# Patient Record
Sex: Male | Born: 1935 | Race: White | Hispanic: No | Marital: Married | Smoking: Former smoker
Health system: Southern US, Community
[De-identification: ages and names within clinical notes are randomized; demographics above are authoritative.]

## PROBLEM LIST (undated history)

## (undated) DIAGNOSIS — E039 Hypothyroidism, unspecified: Secondary | ICD-10-CM

## (undated) DIAGNOSIS — I499 Cardiac arrhythmia, unspecified: Secondary | ICD-10-CM

## (undated) DIAGNOSIS — I498 Other specified cardiac arrhythmias: Secondary | ICD-10-CM

## (undated) DIAGNOSIS — E785 Hyperlipidemia, unspecified: Secondary | ICD-10-CM

## (undated) DIAGNOSIS — I428 Other cardiomyopathies: Secondary | ICD-10-CM

## (undated) DIAGNOSIS — I493 Ventricular premature depolarization: Secondary | ICD-10-CM

## (undated) DIAGNOSIS — I2510 Atherosclerotic heart disease of native coronary artery without angina pectoris: Secondary | ICD-10-CM

## (undated) DIAGNOSIS — C801 Malignant (primary) neoplasm, unspecified: Secondary | ICD-10-CM

## (undated) DIAGNOSIS — M1990 Unspecified osteoarthritis, unspecified site: Secondary | ICD-10-CM

## (undated) HISTORY — DX: Hyperlipidemia, unspecified: E78.5

## (undated) HISTORY — DX: Ventricular premature depolarization: I49.3

## (undated) HISTORY — DX: Atherosclerotic heart disease of native coronary artery without angina pectoris: I25.10

## (undated) HISTORY — DX: Other specified cardiac arrhythmias: I49.8

---

## 2002-07-25 HISTORY — PX: CARDIAC CATHETERIZATION: SHX172

## 2007-05-17 DIAGNOSIS — I2510 Atherosclerotic heart disease of native coronary artery without angina pectoris: Secondary | ICD-10-CM

## 2007-05-17 HISTORY — DX: Atherosclerotic heart disease of native coronary artery without angina pectoris: I25.10

## 2009-04-12 ENCOUNTER — Ambulatory Visit (HOSPITAL_COMMUNITY): Admission: RE | Admit: 2009-04-12 | Discharge: 2009-04-13 | Payer: Self-pay | Admitting: Orthopaedic Surgery

## 2009-04-12 ENCOUNTER — Encounter (INDEPENDENT_AMBULATORY_CARE_PROVIDER_SITE_OTHER): Payer: Self-pay | Admitting: Orthopaedic Surgery

## 2010-06-23 IMAGING — CR DG CHEST 2V
2 series · 2 of 2 positions shown · non-contrast
Comparison: None.

CLINICAL DATA: L4-5 facet cyst and stenosis.  Preoperative
evaluation.  Hypertension.

CHEST - 2 VIEW

[view not recorded (1 of 2)]
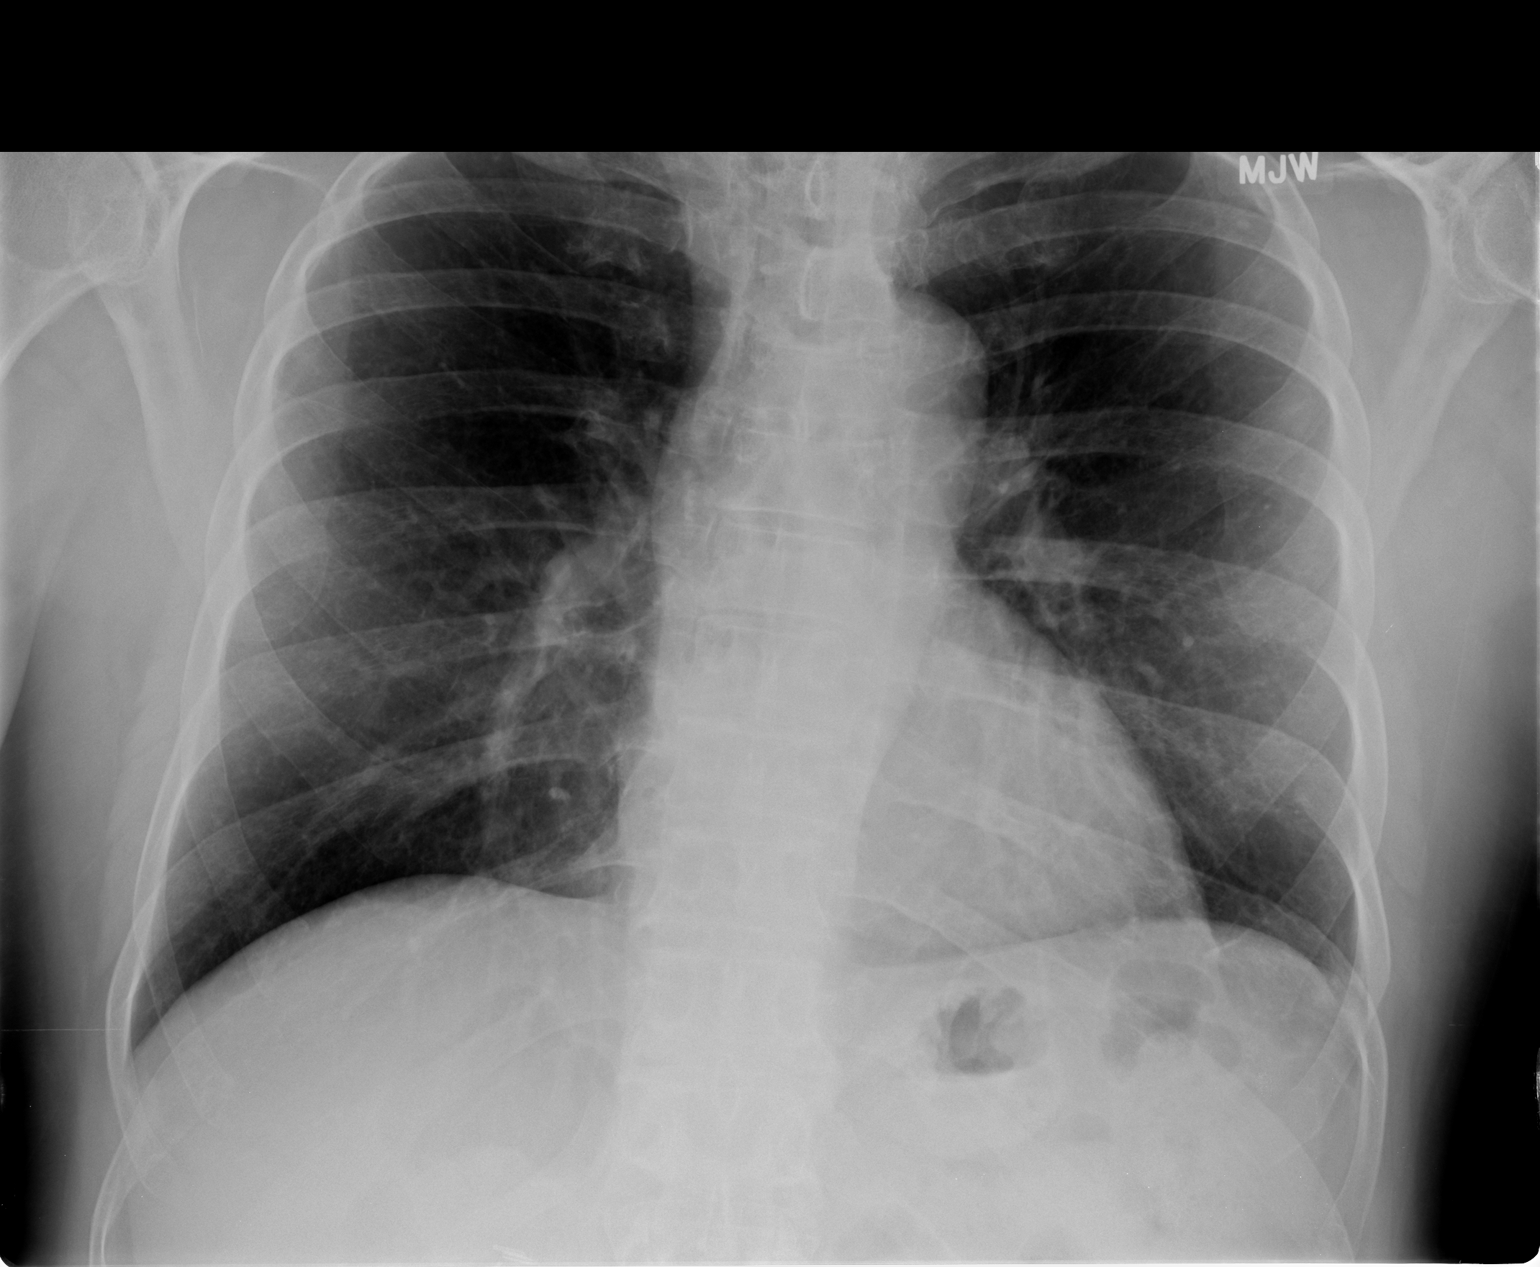

[view not recorded (2 of 2)]
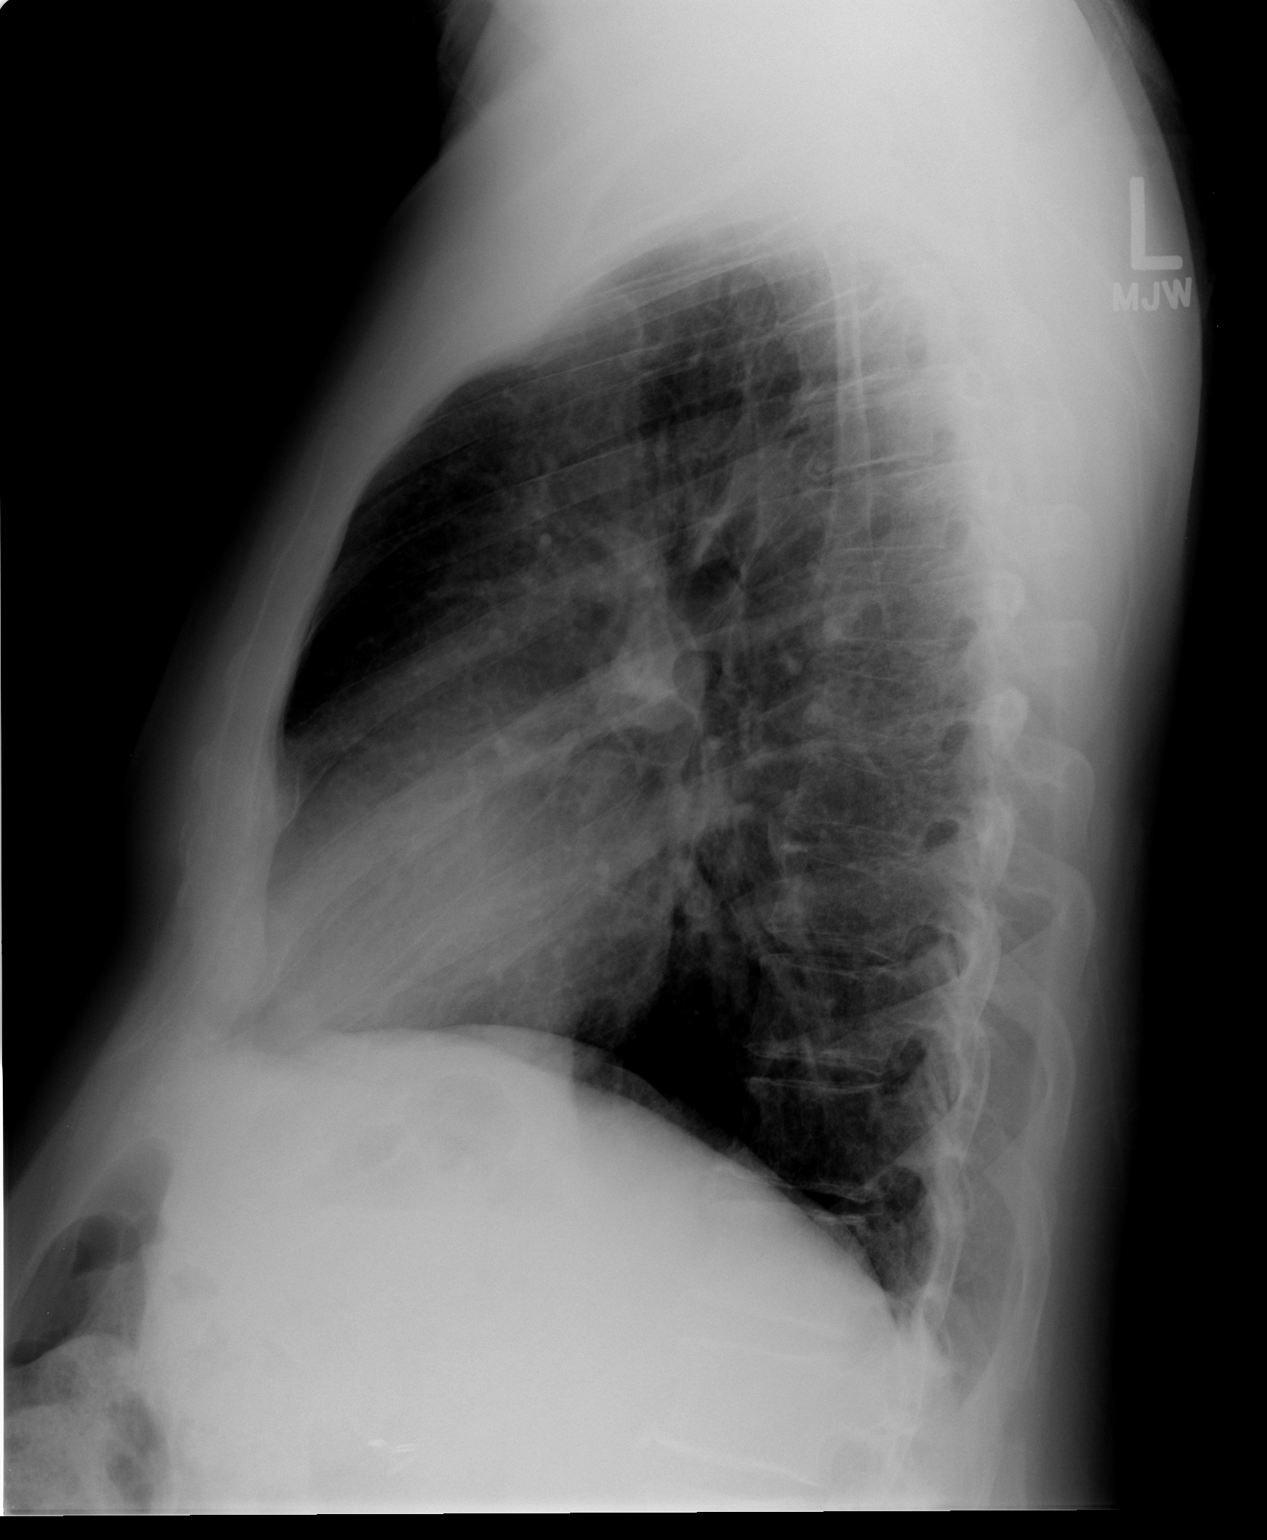

[2 of 2 positions shown; findings below may reference images not displayed]

FINDINGS: Normal sized heart.  Diffuse peribronchial thickening.
Small bone island or calcified granuloma in the lateral left apical
region.  Mild scoliosis.
IMPRESSION: Mild to moderate chronic bronchitic changes.

## 2010-06-23 IMAGING — CR DG LUMBAR SPINE 2-3V
1 series · 1 of 1 positions shown · non-contrast
Comparison: None.

CLINICAL DATA: L4-5 facet cyst.  Stenosis.

LUMBAR SPINE - 2-3 VIEW

[view not recorded]
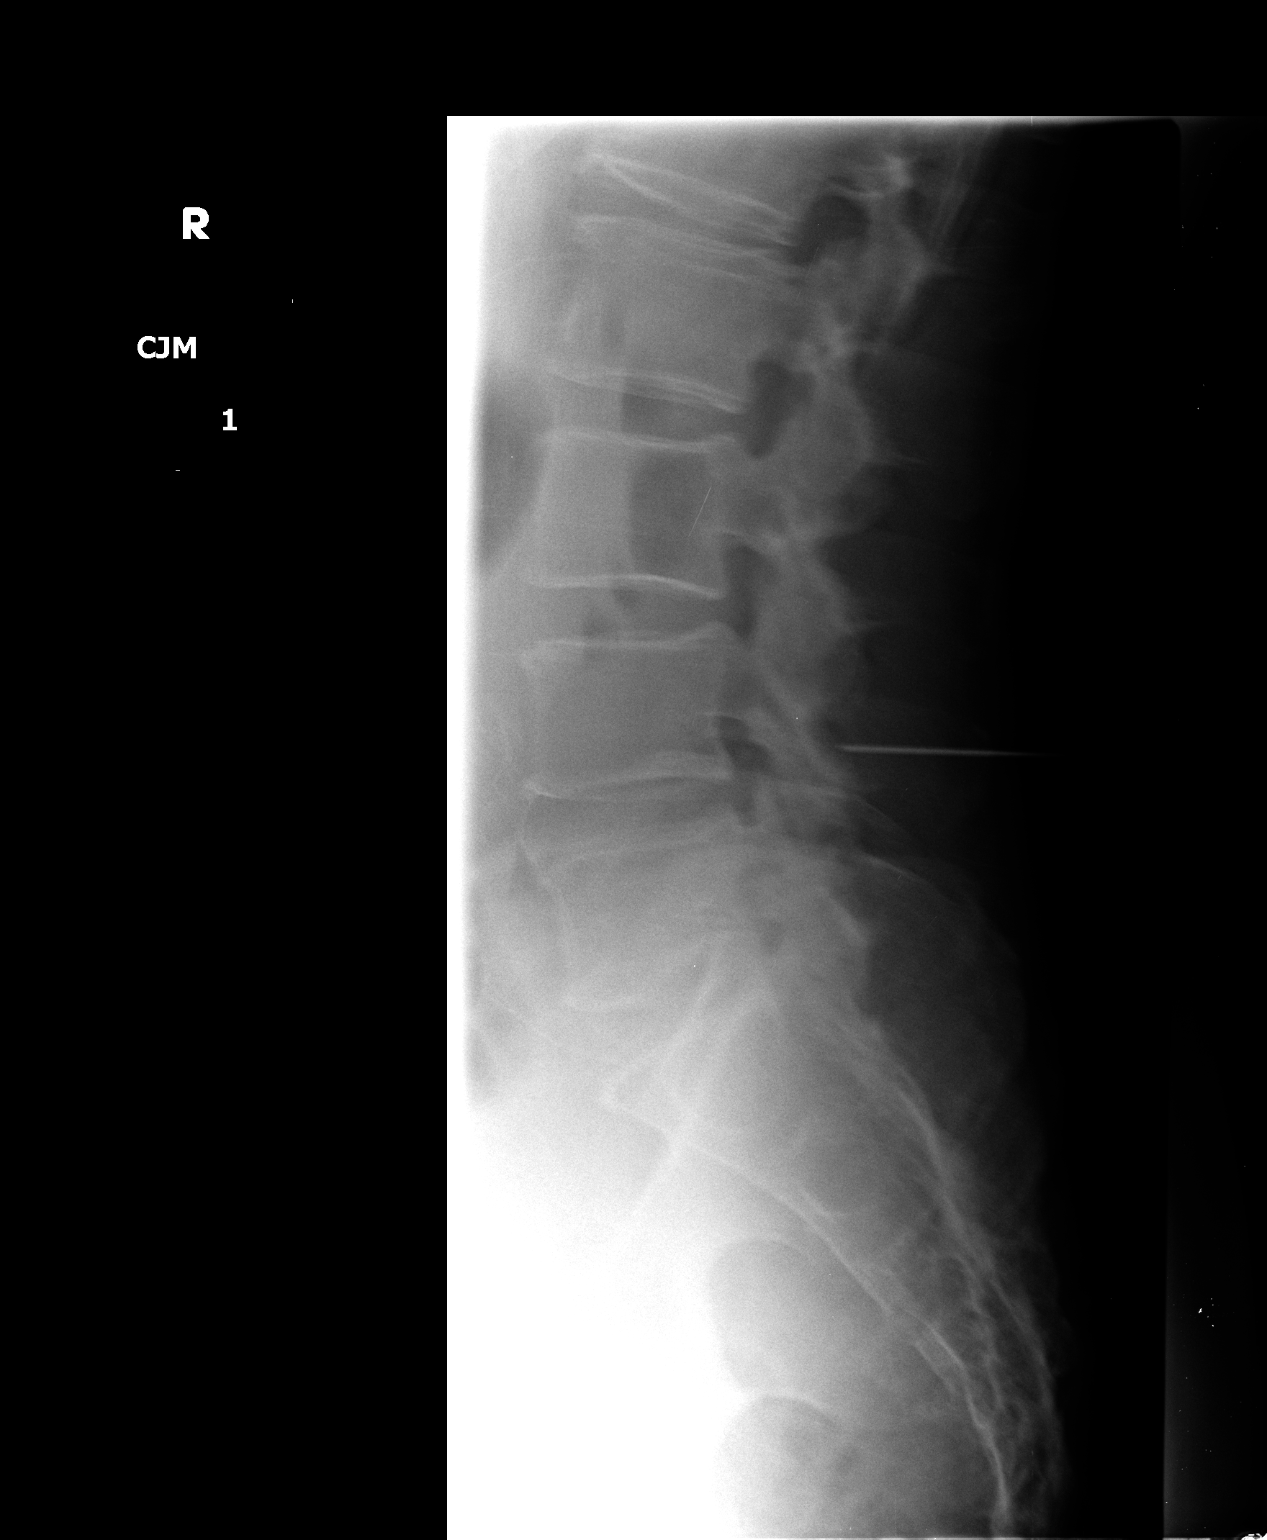

[1 of 1 positions shown; findings below may reference images not displayed]

FINDINGS: Two intraoperative lateral views of the lumbar spine
submitted for review after surgery.

Assuming the last fully open disc space is the L5-S1 level, first
film reveals metallic probe overlapping the L4 spinous process
directed towards the inferior aspect of the L4 vertebral body.

Second film submitted for review reveals localization of the L4-5
disc space level.

Vascular calcifications.
IMPRESSION: Localization L4-5 disc space level.

## 2010-12-07 DIAGNOSIS — I493 Ventricular premature depolarization: Secondary | ICD-10-CM

## 2010-12-07 HISTORY — DX: Ventricular premature depolarization: I49.3

## 2011-01-08 LAB — URINALYSIS, ROUTINE W REFLEX MICROSCOPIC
Bilirubin Urine: NEGATIVE
Glucose, UA: NEGATIVE mg/dL
Ketones, ur: NEGATIVE mg/dL
Protein, ur: NEGATIVE mg/dL
pH: 8 (ref 5.0–8.0)

## 2011-01-08 LAB — COMPREHENSIVE METABOLIC PANEL
Alkaline Phosphatase: 46 U/L (ref 39–117)
BUN: 9 mg/dL (ref 6–23)
CO2: 31 mEq/L (ref 19–32)
Chloride: 108 mEq/L (ref 96–112)
Creatinine, Ser: 1.07 mg/dL (ref 0.4–1.5)
GFR calc non Af Amer: >60 mL/min (ref >60)
Potassium: 4.5 mEq/L (ref 3.5–5.1)
Total Bilirubin: 0.7 mg/dL (ref 0.3–1.2)

## 2011-01-08 LAB — CBC
HCT: 43.5 % (ref 39.0–52.0)
Hemoglobin: 14.8 g/dL (ref 13.0–17.0)
MCV: 100.6 fL — ABNORMAL HIGH (ref 78.0–100.0)
RBC: 4.32 MIL/uL (ref 4.22–5.81)
WBC: 5.9 10*3/uL (ref 4.0–10.5)

## 2011-01-08 LAB — APTT: aPTT: 23 seconds — ABNORMAL LOW (ref 24–37)

## 2011-01-08 LAB — DIFFERENTIAL
Basophils Absolute: 0 10*3/uL (ref 0.0–0.1)
Basophils Relative: 0 % (ref 0–1)
Eosinophils Relative: 2 % (ref 0–5)
Lymphocytes Relative: 46 % (ref 12–46)
Neutro Abs: 2.4 10*3/uL (ref 1.7–7.7)

## 2011-01-08 LAB — PROTIME-INR: Prothrombin Time: 12.1 seconds (ref 11.6–15.2)

## 2011-02-14 NOTE — Op Note (Signed)
NAME:  Paul Huber, Paul Huber NO.:  [id number]   MEDICAL RECORD NO.:  [id number]          PATIENT TYPE:  OIB   LOCATION:  5034                         FACILITY:  [facility]   PHYSICIAN:  Mark C. Ophelia Charter, M.D.    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   POSTOPERATIVE DIAGNOSIS:  L4-5 facet cyst with spinal stenosis.   PROCEDURES:  1. Right L4 hemilaminectomy.  2. Partial L5 laminotomy.  3. Decompression and removal of hemorrhagic facet cyst.   SURGEON:  Mark C. Ophelia Charter, MD   ANESTHESIA:  GOT.   ESTIMATED BLOOD LOSS:  Less than 100 mL.   DRAINS:  None.   This 76 year old male who has had progressive increased right leg pain,  numbness, and L5 nerve root weakness with MRI scan showing a large  multilobulated extradural cyst with septation and severe compression.   PROCEDURE:  After induction of general anesthesia, the patient was  placed in prone with prepping of his back with DuraPrep.  Area squared  with towels, Betadine and Vi-Drape applied, and laminectomy sheets and  drapes.  Needle localization with spinal needle and cross-table lateral  x-ray showed it was at the level 4/5 disk as expected.  Surgical time-  out procedure check list was completed.  Incision was made and this was  at the midline centered over the L4-5 level.  Subperiosteal dissection  was performed out on the lamina and the Taylor retractors placed  laterally with 5-pound weight.  Laminotomy was performed, a little bit  of ligament was removed, and a #4 Penfield was placed 4/5 level at the  level of the disk and confirmed with lateral x-ray.  A 4-mm round bur  was used to thin down the lamina and then remaining was removed by hand  with Kerrison with paddies used to protect the dura, and hockey stick, a  blunt nerve hook for probing to make sure that  the cyst was not adherent  to the hypertrophic ligamentum.  He had some mild acquired stenosis, but  principally the severe stenosis problem was caused by the multilobulated  hemorrhagic cyst.  The cyst material was removed and sent for pathologic  examination.  Some was adherent to the dura, and with operative  microscope, microdissection was performed peeling it off the dura.  Foraminotomy was performed.  Bone was removed out to the level of the  pedicle.  Nerve root was easily identified and L5 portion of the lamina  was removed so that we can get all the way down to the bottom of the  pedicle, and by MRI completely to normal area, remote to the cyst with  some epidural fat present underneath the L5 lamina about halfway down.  Cyst was completely removed.  The dura was intact.  Disk was normal to  palpation.  There was no evidence of herniation.  Irrigation with saline  solution was performed followed by deep closure of the fascia with 0  Vicryl, 2-0 Vicryl for subcutaneous tissue, 4-0 Vicryl subcuticular  closure.  Tincture of benzoin, Steri-Strips,  Marcaine infiltration in the skin a less than 10 mL, 4 x 4s, ABD and  tape dressing.  The patient tolerated the procedure well, was  transferred to the recovery room in stable condition.  Instrument count  and needle count was correct.      Mark C. Ophelia Charter, M.D.  Electronically Signed     MCY/MEDQ  D:  04/12/2009  T:  04/13/2009  Job:  [id number]

## 2013-04-17 ENCOUNTER — Encounter: Payer: Self-pay | Admitting: Cardiovascular Disease

## 2013-04-18 ENCOUNTER — Encounter: Payer: Self-pay | Admitting: Cardiovascular Disease

## 2013-04-18 ENCOUNTER — Ambulatory Visit (INDEPENDENT_AMBULATORY_CARE_PROVIDER_SITE_OTHER): Payer: PRIVATE HEALTH INSURANCE | Admitting: Cardiovascular Disease

## 2013-04-18 VITALS — BP 130/88 | HR 95 | Resp 20 | Ht 71.0 in | Wt 199.3 lb

## 2013-04-18 DIAGNOSIS — I2510 Atherosclerotic heart disease of native coronary artery without angina pectoris: Secondary | ICD-10-CM

## 2013-04-18 DIAGNOSIS — I493 Ventricular premature depolarization: Secondary | ICD-10-CM | POA: Insufficient documentation

## 2013-04-18 DIAGNOSIS — E782 Mixed hyperlipidemia: Secondary | ICD-10-CM | POA: Insufficient documentation

## 2013-04-18 DIAGNOSIS — I498 Other specified cardiac arrhythmias: Secondary | ICD-10-CM | POA: Insufficient documentation

## 2013-04-18 DIAGNOSIS — I428 Other cardiomyopathies: Secondary | ICD-10-CM

## 2013-04-18 DIAGNOSIS — E663 Overweight: Secondary | ICD-10-CM | POA: Insufficient documentation

## 2013-04-18 DIAGNOSIS — Z6825 Body mass index (BMI) 25.0-25.9, adult: Secondary | ICD-10-CM

## 2013-04-18 DIAGNOSIS — I429 Cardiomyopathy, unspecified: Secondary | ICD-10-CM

## 2013-04-18 DIAGNOSIS — I4949 Other premature depolarization: Secondary | ICD-10-CM

## 2013-04-18 MED ORDER — DILTIAZEM HCL ER 240 MG PO CP24: 240.0000 mg | ORAL_CAPSULE | Freq: Every day | ORAL | Status: DC

## 2013-04-18 NOTE — Assessment & Plan Note (Addendum)
He has made substantial progress with weight loss is more physically fit and is new from the obese range to the overweight range. He is encouraged to keep up the good work note is made that he still has evidence of mild hyperglycemia without full-blown diabetes suggesting that he has underlying as well as assistance. We will review his lipid profile when it becomes available.Marland Kitchen

## 2013-04-18 NOTE — Assessment & Plan Note (Signed)
Previous lipid profile showed evidence of elevated triglycerides, low HDL cholesterol and mildly elevated total and LDL cholesterol consistent with a pattern of insulin resistance/metabolic syndrome. He'll be interesting to see how this has improved following substantial weight loss and increased physical activity.

## 2013-04-18 NOTE — Assessment & Plan Note (Signed)
This asymptomatic rhythm was incidentally noted at the time of his last visit and as far as I know has not recurred. It is of unclear etiology and of doubtful clinical significance.

## 2013-04-18 NOTE — Progress Notes (Signed)
Patient ID: DORSIE BURICH, male   DOB: 1935-09-04, 78 y.o.   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 He has made substantial progress with lifestyle changes he has lost 14 pounds in weight and has moved from near-obese to mid-overweight range. He exercises most days of the week, usually by walking. He has made healthy changes in his diet.  He is essentially asymptomatic. He is unaware of any palpitations. Has not had syncope dizziness lightheadedness chest pain or dyspnea. He denies edema orthopnea present nocturnal dyspnea.    No Known Allergies  Current Outpatient Prescriptions  Medication Sig Dispense Refill  . diltiazem (DILACOR XR) 240 MG 24 hr capsule Take 1 capsule (240 mg total) by mouth daily.  90 capsule  3  . fish oil-omega-3 fatty acids 1000 MG capsule Take 2 g by mouth daily.      Marland Kitchen levothyroxine (SYNTHROID, LEVOTHROID) 125 MCG tablet Take 125 mcg by mouth daily before breakfast.      . Multiple Vitamin (MULTIVITAMIN) tablet Take 1 tablet by mouth daily.      . sertraline (ZOLOFT) 50 MG tablet Take 50 mg by mouth daily.       No current facility-administered medications for this visit.    Past Medical History  Diagnosis Date  . CAD (coronary artery disease) 05/17/2007    STRESS TEST- EKG negative for ischemia  . PVC (premature ventricular contraction) 12/07/2010    2D ECHO- EF >55%  . Accelerated junctional rhythm   . Dyslipidemia     Past Surgical History  Procedure Laterality Date  . Cardiac catheterization Left 07/25/2002    No significant CAD    Family History  Problem Relation Age of Onset  . Dementia Mother   . Heart disease Father 50    CABGx2  . Cancer Maternal Grandmother   . Heart attack Maternal Grandfather   . Heart failure Paternal Grandfather     History   Social History  . Marital Status: Married     Spouse Name: N/A    Number of Children: N/A  . Years of Education: N/A   Occupational History  . Not on file.   Social History Main Topics  . Smoking status: Former Smoker    Types: Pipe  . Smokeless tobacco: Former Neurosurgeon    Quit date: 10/02/1979  . Alcohol Use: Yes     Comment: rare  . Drug Use: No  . Sexually Active: Not on file   Other Topics Concern  . Not on file   Social History Narrative  . No narrative on file    Review of systems: The patient specifically denies any chest pain at rest or with exertion, dyspnea at rest or with exertion, orthopnea, paroxysmal nocturnal dyspnea, syncope, palpitations, focal neurological deficits, intermittent claudication, lower extremity edema, unexplained weight gain, cough, hemoptysis or wheezing.  The patient also denies abdominal pain, nausea, vomiting, dysphagia, diarrhea, constipation, polyuria, polydipsia, dysuria, hematuria, frequency, urgency, abnormal bleeding or bruising, fever, chills, unexpected weight changes, mood swings, change in skin or hair texture, change in voice quality, auditory or visual problems, allergic reactions or rashes, new musculoskeletal complaints other than usual "aches and pains".   PHYSICAL EXAM BP 130/88  Pulse 95  Resp 20  Ht 5\' 11"  (1.803 m)  Wt 199 lb 4.8 oz (90.402 kg)  BMI 27.81 kg/m2  General:  Alert, oriented x3, no distress Head: no evidence of trauma, PERRL, EOMI, no exophtalmos or lid lag, no myxedema, no xanthelasma; normal ears, nose and oropharynx Neck: normal jugular venous pulsations and no hepatojugular reflux; brisk carotid pulses without delay and no carotid bruits Chest: clear to auscultation, no signs of consolidation by percussion or palpation, normal fremitus, symmetrical and full respiratory excursions Cardiovascular: normal position and quality of the apical impulse, regular rhythm, normal first and second heart sounds, no murmurs, rubs or gallops Abdomen: no tenderness  or distention, no masses by palpation, no abnormal pulsatility or arterial bruits, normal bowel sounds, no hepatosplenomegaly Extremities: no clubbing, cyanosis or edema; 2+ radial, ulnar and brachial pulses bilaterally; 2+ right femoral, posterior tibial and dorsalis pedis pulses; 2+ left femoral, posterior tibial and dorsalis pedis pulses; no subclavian or femoral bruits Neurological: grossly nonfocal   EKG: NSR, NORMAL  Lipid Panel - was not performed on his recent panel of labs, we'll order today No results found for this basename: chol, trig, hdl, cholhdl, vldl, ldlcalc    BMET    Component Value Date/Time   NA 144 04/12/2009 1335   K 4.5 04/12/2009 1335   CL 108 04/12/2009 1335   CO2 31 04/12/2009 1335   GLUCOSE 100* 04/12/2009 1335   BUN 9 04/12/2009 1335   CREATININE 1.07 04/12/2009 1335   CALCIUM 9.2 04/12/2009 1335   GFRNONAA >60 04/12/2009 1335   GFRAA  Value: >60        The eGFR has been calculated using the MDRD equation. This calculation has not been validated in all clinical situations. eGFR's persistently <60 mL/min signify possible Chronic Kidney Disease. 04/12/2009 1335     ASSESSMENT AND PLAN PVC's (premature ventricular contractions) Has had a lifelong history of PVCs to recently be somewhat less he is not at all symptomatic today.  Overweight (BMI 25.0-29.9) He has made substantial progress with weight loss is more physically fit and is new from the obese range to the overweight range. He is encouraged to keep up the good work note is made that he still has evidence of mild hyperglycemia without full-blown diabetes suggesting that he has underlying as well as assistance. We will review his lipid profile when it becomes available..   Cardiomyopathy Mildly depressed left ventricular systolic function was previously reported on his echocardiogram is but on the most recent ventricular outflow evaluation from March of 2012 left extrasystolic function was completely normal.  Note was made of systolic anterior motion of the mitral valve but without any evidence of LV output tract obstruction.  Accelerated junctional rhythm This asymptomatic rhythm was incidentally noted at the time of his last visit and as far as I know has not recurred. It is of unclear etiology and of doubtful clinical significance.  Mixed hyperlipidemia Previous lipid profile showed evidence of elevated triglycerides, low HDL cholesterol and mildly elevated total and LDL cholesterol consistent with a pattern of insulin resistance/metabolic syndrome. He'll be interesting to see how this has improved following substantial weight loss and increased physical activity.  Orders Placed This Encounter  Procedures  . Lipid Profile  . EKG 12-Lead   Meds ordered this encounter  Medications  . diltiazem (DILACOR XR) 240 MG 24 hr capsule    Sig: Take 1 capsule (240 mg total) by mouth daily.    Dispense:  90 capsule    Refill:  3    Livy Ross  Thurmon Fair, MD, [facility] [phone] office [phone] pager

## 2013-04-18 NOTE — Assessment & Plan Note (Signed)
Has had a lifelong history of PVCs to recently be somewhat less he is not at all symptomatic today.

## 2013-04-18 NOTE — Patient Instructions (Addendum)
Your physician recommends that you schedule a follow-up appointment in: ONE YEAR.  Continue current medications.

## 2013-04-18 NOTE — Assessment & Plan Note (Signed)
Mildly depressed left ventricular systolic function was previously reported on his echocardiogram is but on the most recent ventricular outflow evaluation from March of 2012 left extrasystolic function was completely normal. Note was made of systolic anterior motion of the mitral valve but without any evidence of LV output tract obstruction.

## 2014-05-01 ENCOUNTER — Other Ambulatory Visit: Payer: Self-pay | Admitting: Cardiovascular Disease

## 2014-05-01 NOTE — Telephone Encounter (Signed)
Rx was sent to pharmacy electronically. 

## 2014-05-28 ENCOUNTER — Ambulatory Visit: Payer: PRIVATE HEALTH INSURANCE | Admitting: Cardiovascular Disease

## 2014-06-04 ENCOUNTER — Encounter: Payer: Self-pay | Admitting: Cardiovascular Disease

## 2014-06-04 ENCOUNTER — Ambulatory Visit (INDEPENDENT_AMBULATORY_CARE_PROVIDER_SITE_OTHER): Payer: PRIVATE HEALTH INSURANCE | Admitting: Cardiovascular Disease

## 2014-06-04 VITALS — BP 114/74 | HR 88 | Resp 16 | Ht 71.0 in | Wt 207.0 lb

## 2014-06-04 DIAGNOSIS — I420 Dilated cardiomyopathy: Secondary | ICD-10-CM

## 2014-06-04 DIAGNOSIS — I493 Ventricular premature depolarization: Secondary | ICD-10-CM

## 2014-06-04 DIAGNOSIS — I428 Other cardiomyopathies: Secondary | ICD-10-CM

## 2014-06-04 DIAGNOSIS — I4949 Other premature depolarization: Secondary | ICD-10-CM

## 2014-06-04 DIAGNOSIS — I498 Other specified cardiac arrhythmias: Secondary | ICD-10-CM

## 2014-06-04 NOTE — Patient Instructions (Signed)
Dr. Croitoru recommends that you schedule a follow-up appointment in: One year.   

## 2014-06-04 NOTE — Progress Notes (Signed)
Patient ID: Paul Huber, male   DOB: [birth year], 79 y.o.   MRN: [id number]      Reason for office visit History of cardiomyopathy and arrhythmia  Paul Huber has a history of mildly depressed left ventricular systolic function in the remote past which had normalized by the time he had a fall echocardiogram in 2012. Has a lifelong history of premature ventricular contractions but recently these have been minimally symptomatic. During one of his routine office visits he was found to be in etc. to junctional rhythm, again asymptomatic and of doubtful clinical significance.  Since his last appointment he has not had any new serious health events and denies all cardiac complaints. He feels that his gait is less stable because of problems of his left hip. He has not had syncope or palpitations. He denies shortness of breath. He does complain of some fatigue and appears to have heat intolerance.  He has hypothyroidism on chronic hormone replacement therapy and his last TSH in April was normal. He had pneumonia after a cruise to [place] this summer. He still feels a little weak following that event. His wife thinks he is a little more irritable than usual, he is intolerance to heat. On exam his skin is very soft and velvety and his reflexes seem borderline hyperdynamic.   No Known Allergies  Current Outpatient Prescriptions  Medication Sig Dispense Refill  . diltiazem (DILACOR XR) 240 MG 24 hr capsule TAKE 1 CAPSULE BY MOUTH DAILY.  90 capsule  0  . fish oil-omega-3 fatty acids 1000 MG capsule Take 2 g by mouth daily.      Marland Kitchen levothyroxine (SYNTHROID, LEVOTHROID) 125 MCG tablet Take 125 mcg by mouth daily before breakfast.      . Multiple Vitamin (MULTIVITAMIN) tablet Take 1 tablet by mouth daily.      . sertraline (ZOLOFT) 50 MG tablet Take 50 mg by mouth daily.       No current facility-administered medications for this visit.    Past Medical History  Diagnosis Date  . CAD (coronary artery  disease) 05/17/2007    STRESS TEST- EKG negative for ischemia  . PVC (premature ventricular contraction) 12/07/2010    2D ECHO- EF >55%  . Accelerated junctional rhythm   . Dyslipidemia     Past Surgical History  Procedure Laterality Date  . Cardiac catheterization Left 07/25/2002    No significant CAD    Family History  Problem Relation Age of Onset  . Dementia Mother   . Heart disease Father 50    CABGx2  . Cancer Maternal Grandmother   . Heart attack Maternal Grandfather   . Heart failure Paternal Grandfather     History   Social History  . Marital Status: Married    Spouse Name: N/A    Number of Children: N/A  . Years of Education: N/A   Occupational History  . Not on file.   Social History Main Topics  . Smoking status: Former Smoker    Types: Pipe  . Smokeless tobacco: Former Neurosurgeon    Quit date: 10/02/1979  . Alcohol Use: Yes     Comment: rare  . Drug Use: No  . Sexual Activity: Not on file   Other Topics Concern  . Not on file   Social History Narrative  . No narrative on file    Review of systems: The patient specifically denies any chest pain at rest exertion, dyspnea at rest or with exertion, orthopnea, paroxysmal nocturnal dyspnea, syncope, palpitations,  focal neurological deficits, intermittent claudication, lower extremity edema, unexplained weight gain, cough, hemoptysis or wheezing.   PHYSICAL EXAM BP 114/74  Pulse 88  Resp 16  Ht  (1.803 m)  Wt 207 lb (93.895 kg)  BMI 28.88 kg/m2 General: Alert, oriented x3, no distress  Head: no evidence of trauma, PERRL, EOMI, no exophtalmos or lid lag, no myxedema, no xanthelasma; normal ears, nose and oropharynx  Neck: normal jugular venous pulsations and no hepatojugular reflux; brisk carotid pulses without delay and no carotid bruits  Chest: clear to auscultation, no signs of consolidation by percussion or palpation, normal fremitus, symmetrical and full respiratory excursions  Cardiovascular:  normal position and quality of the apical impulse, regular rhythm, normal first and second heart sounds, no murmurs, rubs or gallops  Abdomen: no tenderness or distention, no masses by palpation, no abnormal pulsatility or arterial bruits, normal bowel sounds, no hepatosplenomegaly  Extremities: no clubbing, cyanosis or edema; 2+ radial, ulnar and brachial pulses bilaterally; 2+ right femoral, posterior tibial and dorsalis pedis pulses; 2+ left femoral, posterior tibial and dorsalis pedis pulses; no subclavian or femoral bruits  Neurological: grossly nonfocal   EKG: NSR, borderline left axis deviation, otherwise normal   BMET Creatinine 0.96, sodium 145, potassium 4.2, glucose 80, normal liver function tests in April 2015  Lipid profile Total cholesterol 176, triglycerides 128, HDL 32, LDL 118 in July 2014  ASSESSMENT AND PLAN  Paul Huber has a questionable diagnosis of cardiomyopathy. Reportedly depressed left ventricular systolic function in the remote past was normal on an echocardiogram from 2012. He has a history of symptomatic PVCs but currently this problem is quite hesitant without any specific therapy. He has normal blood pressure. He has a few vague symptoms that are suggestive of thyrotoxicosis (relatively fast resting heart rate, intolerance to heat, irritability, soft skin) but his most recent TSH in April was normal.  No changes are made to his medical regimen. Followup on a yearly basis.  Junious Silk, MD, [facility] [phone] office [phone] pager

## 2014-07-27 ENCOUNTER — Other Ambulatory Visit: Payer: Self-pay | Admitting: Cardiovascular Disease

## 2014-07-28 NOTE — Telephone Encounter (Signed)
Rx was sent to pharmacy electronically. 

## 2015-07-01 ENCOUNTER — Encounter: Payer: Self-pay | Admitting: Cardiovascular Disease

## 2015-07-09 ENCOUNTER — Other Ambulatory Visit: Payer: Self-pay | Admitting: Cardiovascular Disease

## 2015-07-13 ENCOUNTER — Other Ambulatory Visit: Payer: Self-pay | Admitting: *Deleted

## 2015-07-13 MED ORDER — DILTIAZEM HCL ER 240 MG PO CP24: 240.0000 mg | ORAL_CAPSULE | Freq: Every day | ORAL | Status: DC

## 2015-08-06 ENCOUNTER — Encounter: Payer: Self-pay | Admitting: Cardiovascular Disease

## 2015-08-12 ENCOUNTER — Encounter: Payer: Self-pay | Admitting: Cardiovascular Disease

## 2015-08-12 ENCOUNTER — Ambulatory Visit (INDEPENDENT_AMBULATORY_CARE_PROVIDER_SITE_OTHER): Payer: PRIVATE HEALTH INSURANCE | Admitting: Cardiovascular Disease

## 2015-08-12 VITALS — BP 124/78 | HR 72 | Ht 72.0 in | Wt 215.6 lb

## 2015-08-12 DIAGNOSIS — E663 Overweight: Secondary | ICD-10-CM | POA: Diagnosis not present

## 2015-08-12 DIAGNOSIS — I429 Cardiomyopathy, unspecified: Secondary | ICD-10-CM

## 2015-08-12 DIAGNOSIS — I493 Ventricular premature depolarization: Secondary | ICD-10-CM

## 2015-08-12 DIAGNOSIS — E782 Mixed hyperlipidemia: Secondary | ICD-10-CM | POA: Diagnosis not present

## 2015-08-12 MED ORDER — DILTIAZEM HCL ER 240 MG PO CP24: 240.0000 mg | ORAL_CAPSULE | Freq: Every day | ORAL | Status: DC

## 2015-08-12 NOTE — Progress Notes (Signed)
Patient ID: Paul Huber, male   DOB: 12-16-1934, 80 y.o.   MRN: [id number]     Cardiology Office Note   Date:  08/12/2015   ID:  Paul Huber, DOB 1934-11-03, MRN [id number]  PCP:  Marcell Anger, NP  Cardiologist:   Thurmon Fair, MD   chief complaint: Follow-up for symptomatic PVCs and history of cardiomyopathy     History of Present Illness: Paul Huber is a 80 y.o. male who presents for routine follow-up. He has no cardiovascular complaints. Physical activity has been limited by pain in his right hip. He remains overweight, has actually gained weight and is borderline obese. He continues to work as a Warden/ranger.  The patient specifically denies any chest pain at rest or with exertion, dyspnea at rest or with exertion, orthopnea, paroxysmal nocturnal dyspnea, syncope, palpitations, focal neurological deficits, intermittent claudication, lower extremity edema, unexplained weight gain, cough, hemoptysis or wheezing.  The patient also denies abdominal pain, nausea, vomiting, dysphagia, diarrhea, constipation, polyuria, polydipsia, dysuria, hematuria, frequency, urgency, abnormal bleeding or bruising, fever, chills, unexpected weight changes, mood swings, change in skin or hair texture, change in voice quality, auditory or visual problems, allergic reactions or rashes, new musculoskeletal complaints other than usual "aches and pains".  Paul Huber has a history of mildly depressed left ventricular systolic function in the remote past which had normalized by the time he had a fall echocardiogram in 2012. Has a lifelong history of premature ventricular contractions but recently these have been minimally symptomatic. During one of his routine office visits he was found to be in etc. to junctional rhythm, again asymptomatic and of doubtful clinical significance.   Past Medical History  Diagnosis Date  . CAD (coronary artery disease) 05/17/2007    STRESS TEST- EKG negative for  ischemia  . PVC (premature ventricular contraction) 12/07/2010    2D ECHO- EF >55%  . Accelerated junctional rhythm   . Dyslipidemia     Past Surgical History  Procedure Laterality Date  . Cardiac catheterization Left 07/25/2002    No significant CAD     Current Outpatient Prescriptions  Medication Sig Dispense Refill  . diltiazem (DILACOR XR) 240 MG 24 hr capsule Take 1 capsule (240 mg total) by mouth daily. 90 capsule 0  . fish oil-omega-3 fatty acids 1000 MG capsule Take 2 g by mouth daily.    Marland Kitchen levothyroxine (SYNTHROID, LEVOTHROID) 125 MCG tablet Take 125 mcg by mouth daily before breakfast.    . Multiple Vitamin (MULTIVITAMIN) tablet Take 1 tablet by mouth daily.     No current facility-administered medications for this visit.    Allergies:   Review of patient's allergies indicates no known allergies.    Social History:  The patient  reports that he has quit smoking. His smoking use included Pipe. He quit smokeless tobacco use about 35 years ago. He reports that he drinks alcohol. He reports that he does not use illicit drugs.   Family History:  The patient's family history includes Cancer in his maternal grandmother; Dementia in his mother; Heart attack in his maternal grandfather; Heart disease (age of onset: 69) in his father; Heart failure in his paternal grandfather.    ROS:  Please see the history of present illness.    Otherwise, review of systems positive for none.   All other systems are reviewed and negative.    PHYSICAL EXAM: VS:  BP 124/78 mmHg  Pulse 72  Ht 6' (1.829 m)  Wt 215 lb 9.6 oz (  97.796 kg)  BMI 29.23 kg/m2 , BMI Body mass index is 29.23 kg/(m^2).  General: Alert, oriented x3, no distress Head: no evidence of trauma, PERRL, EOMI, no exophtalmos or lid lag, no myxedema, no xanthelasma; normal ears, nose and oropharynx Neck: normal jugular venous pulsations and no hepatojugular reflux; brisk carotid pulses without delay and no carotid  bruits Chest: clear to auscultation, no signs of consolidation by percussion or palpation, normal fremitus, symmetrical and full respiratory excursions Cardiovascular: normal position and quality of the apical impulse, regular rhythm, normal first and second heart sounds, no murmurs, rubs or gallops Abdomen: no tenderness or distention, no masses by palpation, no abnormal pulsatility or arterial bruits, normal bowel sounds, no hepatosplenomegaly Extremities: no clubbing, cyanosis or edema; 2+ radial, ulnar and brachial pulses bilaterally; 2+ right femoral, posterior tibial and dorsalis pedis pulses; 2+ left femoral, posterior tibial and dorsalis pedis pulses; no subclavian or femoral bruits Neurological: grossly nonfocal Psych: euthymic mood, full affect   EKG:  EKG is ordered today. The ekg ordered today demonstrates normal sinus rhythm   Recent Labs: No results found for requested labs within last 365 days.    Lipid Panel No results found for: CHOL, TRIG, HDL, CHOLHDL, VLDL, LDLCALC, LDLDIRECT    Wt Readings from Last 3 Encounters:  08/12/15 215 lb 9.6 oz (97.796 kg)  06/04/14 207 lb (93.895 kg)  04/18/13 199 lb 4.8 oz (90.402 kg)      ASSESSMENT AND PLAN:  He currently does not have any symptoms to suggest cardiomyopathy/congestive heart failure and does not have symptoms related to ventricular ectopy. From a clinical standpoint he appears to be euthyroid. Weight loss is again recommended. No changes are made to his medications. Yearly follow-up.    Current medicines are reviewed at length with the patient today.  The patient does not have concerns regarding medicines.  The following changes have been made:  no change  Labs/ tests ordered today include:  No orders of the defined types were placed in this encounter.     Patient Instructions  Dr. Royann Shivers recommends that you schedule a follow-up appointment in: ONE YEAR       SignedThurmon Fair, MD   08/12/2015 5:50 PM    Thurmon Fair, MD, [facility] [phone] office [phone] pager

## 2015-08-12 NOTE — Patient Instructions (Signed)
Dr. Croitoru recommends that you schedule a follow-up appointment in: ONE YEAR   

## 2016-08-10 ENCOUNTER — Other Ambulatory Visit: Payer: Self-pay | Admitting: Cardiovascular Disease

## 2016-10-06 ENCOUNTER — Ambulatory Visit: Payer: PRIVATE HEALTH INSURANCE | Admitting: Cardiovascular Disease

## 2016-11-10 ENCOUNTER — Encounter: Payer: Self-pay | Admitting: Cardiovascular Disease

## 2016-11-10 ENCOUNTER — Ambulatory Visit (INDEPENDENT_AMBULATORY_CARE_PROVIDER_SITE_OTHER): Payer: PRIVATE HEALTH INSURANCE | Admitting: Cardiovascular Disease

## 2016-11-10 VITALS — BP 118/68 | HR 62 | Ht 72.0 in | Wt 190.0 lb

## 2016-11-10 DIAGNOSIS — I429 Cardiomyopathy, unspecified: Secondary | ICD-10-CM | POA: Diagnosis not present

## 2016-11-10 DIAGNOSIS — I493 Ventricular premature depolarization: Secondary | ICD-10-CM

## 2016-11-10 DIAGNOSIS — I498 Other specified cardiac arrhythmias: Secondary | ICD-10-CM

## 2016-11-10 MED ORDER — DILTIAZEM HCL ER 120 MG PO CP24: 120.0000 mg | ORAL_CAPSULE | Freq: Every day | ORAL | 3 refills | Status: DC

## 2016-11-10 NOTE — Progress Notes (Signed)
Cardiology Office Note    Date:  11/10/2016   ID:  Paul Huber, DOB [birth year], MRN [id number]  PCP:  Marcell Anger, NP  Cardiologist:   Thurmon Fair, MD   Chief Complaint  Patient presents with  . Follow-up    History of Present Illness:  Paul Huber is a 82 y.o. male with a history of nonischemic cardiomyopathy and symptomatic PVCs presenting for follow-up. He was borderline obese but has made excellent efforts to improve his diet and has lost 25 pounds since his last appointment. He is not troubled by palpitations. She denies dizziness/syncope, chest pain or dyspnea at rest or with activity. He does not have lower extremity edema or claudication. He denies focal neurological complaints.  In the remote past he had a depressed left ventricular systolic function measured at 40-98 % in 2003, but repeat echo in 2012 showed normalization of systolic function. Cardiac catheterization in 2003 showed normal coronary arteries. He had symptomatic PACs and PVCs and has been on treatment with diltiazem for a long time.  For the second time in the last few years he presents to the office with asymptomatic accelerated junctional rhythm (negative P waves in the inferior leads with a relatively short PR interval).  Past Medical History:  Diagnosis Date  . Accelerated junctional rhythm   . CAD (coronary artery disease) 05/17/2007   STRESS TEST- EKG negative for ischemia  . Dyslipidemia   . PVC (premature ventricular contraction) 12/07/2010   2D ECHO- EF >55%    Past Surgical History:  Procedure Laterality Date  . CARDIAC CATHETERIZATION Left 07/25/2002   No significant CAD    Current Medications: Outpatient Medications Prior to Visit  Medication Sig Dispense Refill  . fish oil-omega-3 fatty acids 1000 MG capsule Take 2 g by mouth daily.    Marland Kitchen levothyroxine (SYNTHROID, LEVOTHROID) 125 MCG tablet Take 125 mcg by mouth daily before breakfast.    . Multiple Vitamin (MULTIVITAMIN)  tablet Take 1 tablet by mouth daily.    Marland Kitchen diltiazem (DILACOR XR) 240 MG 24 hr capsule TAKE 1 CAPSULE BY MOUTH DAILY. 90 capsule 3   No facility-administered medications prior to visit.      Allergies:   Patient has no known allergies.   Social History   Social History  . Marital status: Married    Spouse name: N/A  . Number of children: N/A  . Years of education: N/A   Social History Main Topics  . Smoking status: Former Smoker    Types: Pipe  . Smokeless tobacco: Former Neurosurgeon    Quit date: 10/02/1979  . Alcohol use Yes     Comment: rare  . Drug use: No  . Sexual activity: Not Asked   Other Topics Concern  . None   Social History Narrative  . None     Family History:  The patient's family history includes Cancer in his maternal grandmother; Dementia in his mother; Heart attack in his maternal grandfather; Heart disease (age of onset: 15) in his father; Heart failure in his paternal grandfather.   ROS:   Please see the history of present illness.    ROS All other systems reviewed and are negative.   PHYSICAL EXAM:   VS:  BP 118/68 (BP Location: Right Arm, Patient Position: Sitting, Cuff Size: Normal)   Pulse 62   Ht 6' (1.829 m)   Wt 86.2 kg (190 lb)   BMI 25.77 kg/m    GEN: Well nourished, well developed, in  no acute distress  HEENT: normal  Neck: no JVD, carotid bruits, or masses Cardiac: RRR; no murmurs, rubs, or gallops,no edema  Respiratory:  clear to auscultation bilaterally, normal work of breathing GI: soft, nontender, nondistended, + BS MS: no deformity or atrophy  Skin: warm and dry, no rash Neuro:  Alert and Oriented x 3, Strength and sensation are intact Psych: euthymic mood, full affect  Wt Readings from Last 3 Encounters:  11/10/16 86.2 kg (190 lb)  08/12/15 97.8 kg (215 lb 9.6 oz)  06/04/14 93.9 kg (207 lb)      Studies/Labs Reviewed:   EKG:  EKG is ordered today.  The ekg ordered today demonstrates Accelerated junctional rhythm  (negative P waves in the inferior leads, positive in aVR), PR interval 138 ms, flat T waves in the lateral leads, normal QTC 420 ms  Recent Labs: Creatinine 0.9, glucose 98 normal LFTs, hemoglobin 14.9  Lipid Panel May 2017 triglycerides 120, HDL 41, LDL 113, total cholesterol 178  ASSESSMENT:    1. PVC's (premature ventricular contractions)      PLAN:  In order of problems listed above:  1. Accelerated junctional rhythm: Reduce diltiazem 220 mg daily and consider stopping it altogether in several months if he does not have symptoms from ectopy. 2. History of cardiomyopathy: Last assessment showed normal left ventricle systolic function and he has no signs or symptoms of congestive heart failure. 3. PVCs, not currently symptomatic    Medication Adjustments/Labs and Tests Ordered: Current medicines are reviewed at length with the patient today.  Concerns regarding medicines are outlined above.  Medication changes, Labs and Tests ordered today are listed in the Patient Instructions below. Patient Instructions  Dr Royann Shivers has recommended making the following medication changes: 1. DECREASE Diltiazem to 120 mg daily  Your physician recommends that you schedule a follow-up appointment in 1 year. You will receive a reminder letter in the mail two months in advance. If you don't receive a letter, please call our office to schedule the follow-up appointment.  If you need a refill on your cardiac medications before your next appointment, please call your pharmacy.    Signed, Thurmon Fair, MD  11/10/2016 1:34 PM    [facility] [address] [place], [place], [place]  [address] Phone: [phone]; Fax: [phone]

## 2016-11-10 NOTE — Patient Instructions (Signed)
Dr Royann Shivers has recommended making the following medication changes: 1. DECREASE Diltiazem to 120 mg daily  Your physician recommends that you schedule a follow-up appointment in 1 year. You will receive a reminder letter in the mail two months in advance. If you don't receive a letter, please call our office to schedule the follow-up appointment.  If you need a refill on your cardiac medications before your next appointment, please call your pharmacy.

## 2017-11-09 ENCOUNTER — Other Ambulatory Visit: Payer: Self-pay | Admitting: Cardiovascular Disease

## 2017-11-09 NOTE — Telephone Encounter (Signed)
REFILL 

## 2017-12-07 ENCOUNTER — Encounter: Payer: Self-pay | Admitting: Cardiovascular Disease

## 2017-12-13 ENCOUNTER — Encounter: Payer: Self-pay | Admitting: Cardiovascular Disease

## 2018-01-01 ENCOUNTER — Telehealth: Payer: Self-pay | Admitting: Cardiovascular Disease

## 2018-01-01 NOTE — Telephone Encounter (Signed)
°  New message  Pt wife verbalized that she is calling for RN  She said that the pt labs all came back elevated and she  Wants Dr.Croitoru to advise

## 2018-01-01 NOTE — Telephone Encounter (Signed)
Pt's wife aware that we have no copy of recent lab and that whomever ordered can address results.Also instructed to have copies faxed to Dr Royann Shivers for our records as pt has an appt on 01/11/18 with Dr Royann Shivers .Wife verbalized understanding ./cy

## 2018-01-11 ENCOUNTER — Ambulatory Visit: Payer: PRIVATE HEALTH INSURANCE | Admitting: Cardiovascular Disease

## 2018-02-01 ENCOUNTER — Ambulatory Visit: Payer: PRIVATE HEALTH INSURANCE | Admitting: Cardiovascular Disease

## 2018-02-01 ENCOUNTER — Encounter: Payer: Self-pay | Admitting: Cardiovascular Disease

## 2018-02-01 VITALS — BP 146/72 | HR 79 | Ht 71.5 in | Wt 206.8 lb

## 2018-02-01 DIAGNOSIS — I429 Cardiomyopathy, unspecified: Secondary | ICD-10-CM

## 2018-02-01 DIAGNOSIS — E663 Overweight: Secondary | ICD-10-CM | POA: Diagnosis not present

## 2018-02-01 DIAGNOSIS — E782 Mixed hyperlipidemia: Secondary | ICD-10-CM

## 2018-02-01 DIAGNOSIS — R0602 Shortness of breath: Secondary | ICD-10-CM | POA: Diagnosis not present

## 2018-02-01 DIAGNOSIS — I498 Other specified cardiac arrhythmias: Secondary | ICD-10-CM

## 2018-02-01 NOTE — Progress Notes (Signed)
Cardiology Office Note    Date:  02/01/2018   ID:  Paul Huber, DOB [birth year], MRN [id number]  PCP:  Eda Paschal, NP  Cardiologist:   Thurmon Fair, MD   No chief complaint on file.   History of Present Illness:  Paul Huber is a 83 y.o. male with a history of nonischemic cardiomyopathy and symptomatic PVCs presenting for follow-up.   He has gained back a lot of weight, 16 pounds after he lost 25.  In parallel, he noticed worsening of his exercise tolerance (NYHA functional class II).  His blood pressure was a little high when he first checked in today, but when he saw his primary care provider recently his blood pressure was 120/80 mmHg. Re-check blood pressure today was 131/69 mmHg.  His recent lipid profile showed long-standing problems with low HDL, mildly elevated triglycerides, moderately elevated LDL cholesterol.  For his not to take cholesterol-lowering medicines, reports that his younger brother was told he needs bypass surgery.  In the remote past he had a depressed left ventricular systolic function measured at 40-98 % in 2003, but repeat echo in 2012 showed normalization of systolic function. Cardiac catheterization in 2003 showed normal coronary arteries. He had symptomatic PACs and PVCs and has been on treatment with diltiazem for a long time.  Twice in the last few years he presents to the office with asymptomatic accelerated junctional rhythm (negative P waves in the inferior leads with a relatively short PR interval).  Today he is in normal sinus rhythm.   Past Medical History:  Diagnosis Date  . Accelerated junctional rhythm   . CAD (coronary artery disease) 05/17/2007   STRESS TEST- EKG negative for ischemia  . Dyslipidemia   . PVC (premature ventricular contraction) 12/07/2010   2D ECHO- EF >55%    Past Surgical History:  Procedure Laterality Date  . CARDIAC CATHETERIZATION Left 07/25/2002   No significant CAD    Current Medications: Outpatient  Medications Prior to Visit  Medication Sig Dispense Refill  . diltiazem (DILACOR XR) 120 MG 24 hr capsule TAKE 1 CAPSULE BY MOUTH DAILY. 90 capsule 3  . fish oil-omega-3 fatty acids 1000 MG capsule Take 2 g by mouth daily.    Marland Kitchen levothyroxine (SYNTHROID, LEVOTHROID) 137 MCG tablet Take 1 tablet by mouth daily.    . Multiple Vitamin (MULTIVITAMIN) tablet Take 1 tablet by mouth daily.    . sertraline (ZOLOFT) 100 MG tablet Take 100 mg by mouth daily.  11  . levothyroxine (SYNTHROID, LEVOTHROID) 125 MCG tablet Take 125 mcg by mouth daily before breakfast.    . sertraline (ZOLOFT) 50 MG tablet Take 50 mg by mouth daily.  11   No facility-administered medications prior to visit.      Allergies:   Patient has no known allergies.   Social History   Socioeconomic History  . Marital status: Married    Spouse name: Not on file  . Number of children: Not on file  . Years of education: Not on file  . Highest education level: Not on file  Occupational History  . Not on file  Social Needs  . Financial resource strain: Not on file  . Food insecurity:    Worry: Not on file    Inability: Not on file  . Transportation needs:    Medical: Not on file    Non-medical: Not on file  Tobacco Use  . Smoking status: Former Smoker    Types: Pipe  . Smokeless tobacco:  Former Neurosurgeon    Quit date: 10/02/1979  Substance and Sexual Activity  . Alcohol use: Yes    Comment: rare  . Drug use: No  . Sexual activity: Not on file  Lifestyle  . Physical activity:    Days per week: Not on file    Minutes per session: Not on file  . Stress: Not on file  Relationships  . Social connections:    Talks on phone: Not on file    Gets together: Not on file    Attends religious service: Not on file    Active member of club or organization: Not on file    Attends meetings of clubs or organizations: Not on file    Relationship status: Not on file  Other Topics Concern  . Not on file  Social History Narrative  .  Not on file     Family History:  The patient's family history includes Cancer in his maternal grandmother; Dementia in his mother; Heart attack in his maternal grandfather; Heart disease (age of onset: 73) in his father; Heart failure in his paternal grandfather.   ROS:   Please see the history of present illness.    ROS All other systems reviewed and are negative.   PHYSICAL EXAM:   VS:  BP (!) 146/72   Pulse 79   Ht 5' 11.5" (1.816 m)   Wt 206 lb 12.8 oz (93.8 kg)   BMI 28.44 kg/m    GEN: Well nourished, well developed, in no acute distress  HEENT: normal  Neck: no JVD, carotid bruits, or masses Cardiac: RRR; no murmurs, rubs, or gallops,no edema  Respiratory:  clear to auscultation bilaterally, normal work of breathing GI: soft, nontender, nondistended, + BS MS: no deformity or atrophy  Skin: warm and dry, no rash Neuro:  Alert and Oriented x 3, Strength and sensation are intact Psych: euthymic mood, full affect  Wt Readings from Last 3 Encounters:  02/01/18 206 lb 12.8 oz (93.8 kg)  11/10/16 190 lb (86.2 kg)  08/12/15 215 lb 9.6 oz (97.8 kg)      Studies/Labs Reviewed:   EKG:  EKG is ordered today.  The ekg ordered today demonstrates rhythm, incomplete left bundle branch block (106 ms), no repolarization of normalities, QTC 486 ms Recent Labs: Recent labs from PCP glucose 108, creatinine 1.09, total cholesterol 202 and LDL140  ASSESSMENT:    1. Shortness of breath   2. Accelerated junctional rhythm   3. Cardiomyopathy, unspecified type (HCC)   4. Mixed hyperlipidemia   5. Overweight (BMI 25.0-29.9)      PLAN:  In order of problems listed above:  1. Accelerated junctional rhythm: Was never symptomatic, normal rhythm today. 2. History of cardiomyopathy: When we last evaluated left ventricular systolic function his EF had returned to normal.  There are no clinical findings to suggest congestive heart failure.  I would like to schedule him for plain treadmill  stress test to get an objective assessment of his exercise tolerance.  Suspect that the slight increase in shortness of breath described is related to weight gain. 3. Hyperlipidemia: His lipid profile strongly suggestive of insulin resistance/metabolic syndrome.  Also has borderline elevation in blood glucose.  He would rather not take lipid-lowering therapy, but he definitely needs to change his lifestyle.  He is to increase physical activity and reduce the intake of sugars and starches.  Try to lose at least 15 pounds.    Medication Adjustments/Labs and Tests Ordered: Current medicines are reviewed  at length with the patient today.  Concerns regarding medicines are outlined above.  Medication changes, Labs and Tests ordered today are listed in the Patient Instructions below. Patient Instructions  Dr Royann Shivers recommends that you continue on your current medications as directed. Please refer to the Current Medication list given to you today.  Your physician has requested that you have an exercise tolerance test. For further information please visit [email or web address] Please also follow instruction sheet, as given.  Dr Royann Shivers recommends that you schedule a follow-up appointment in 12 months. You will receive a reminder letter in the mail two months in advance. If you don't receive a letter, please call our office to schedule the follow-up appointment.  If you need a refill on your cardiac medications before your next appointment, please call your pharmacy.    Signed, Thurmon Fair, MD  02/01/2018 12:59 PM    [facility] [address], [place], [place]  [address] Phone: [phone]; Fax: [phone]

## 2018-02-01 NOTE — Patient Instructions (Signed)
Dr Croitoru recommends that you continue on your current medications as directed. Please refer to the Current Medication list given to you today.  Your physician has requested that you have an exercise tolerance test. For further information please visit [email or web address]. Please also follow instruction sheet, as given.  Dr Croitoru recommends that you schedule a follow-up appointment in 12 months. You will receive a reminder letter in the mail two months in advance. If you don't receive a letter, please call our office to schedule the follow-up appointment.  If you need a refill on your cardiac medications before your next appointment, please call your pharmacy. 

## 2018-02-12 ENCOUNTER — Telehealth (HOSPITAL_COMMUNITY): Payer: Self-pay

## 2018-02-12 NOTE — Telephone Encounter (Signed)
Encounter complete. 

## 2018-02-13 ENCOUNTER — Encounter: Payer: Self-pay | Admitting: Cardiovascular Disease

## 2018-02-14 ENCOUNTER — Ambulatory Visit (HOSPITAL_COMMUNITY)
Admission: RE | Admit: 2018-02-14 | Discharge: 2018-02-14 | Disposition: A | Discharge status: Home / Self Care | Payer: PRIVATE HEALTH INSURANCE | Source: Ambulatory Visit | Attending: Cardiology | Admitting: Cardiology

## 2018-02-14 DIAGNOSIS — R0602 Shortness of breath: Secondary | ICD-10-CM

## 2018-02-14 LAB — EXERCISE TOLERANCE TEST
CHL CUP MPHR: 139 {beats}/min
CHL CUP RESTING HR STRESS: 75 {beats}/min
CHL RATE OF PERCEIVED EXERTION: 19
CSEPEDS: 6 s
CSEPEW: 5.9 METS
CSEPPHR: 137 {beats}/min
Exercise duration (min): 4 min
Percent HR: 98 %

## 2018-12-05 ENCOUNTER — Other Ambulatory Visit: Payer: Self-pay | Admitting: Cardiovascular Disease

## 2018-12-05 MED ORDER — DILTIAZEM HCL ER 120 MG PO CP24: 120.0000 mg | ORAL_CAPSULE | Freq: Every day | ORAL | 0 refills | Status: DC

## 2018-12-05 NOTE — Telephone Encounter (Signed)
Medication ordered. And submitted.

## 2018-12-05 NOTE — Telephone Encounter (Signed)
New Message         Patient is calling in for an appt, there is nothing available waiting on the June schedule to come available. Patent's needs Diltiezem 120 mg filled for 90 days until we can get him in. Nothing is available at this time

## 2018-12-05 NOTE — Telephone Encounter (Signed)
New Message    *STAT* If patient is at the pharmacy, call can be transferred to refill team.   1. Which medications need to be refilled? (please list name of each medication and dose if known) diltiazem (DILACOR XR) 120 MG 24 hr capsule    2. Which pharmacy/location (including street and city if local pharmacy) is medication to be sent to? [facility]  3. Do they need a 30 day or 90 day supply? 90 day      Rx refill was sent to [facility] he wants it to go to [facility].

## 2018-12-06 MED ORDER — DILTIAZEM HCL ER 120 MG PO CP24: 120.0000 mg | ORAL_CAPSULE | Freq: Every day | ORAL | 0 refills | Status: DC

## 2018-12-06 NOTE — Telephone Encounter (Signed)
Rx(s) sent to pharmacy electronically.  

## 2019-01-22 ENCOUNTER — Telehealth: Payer: Self-pay | Admitting: Cardiovascular Disease

## 2019-01-22 NOTE — Telephone Encounter (Signed)
LVM to call and schedule 1 year followup.

## 2019-02-05 ENCOUNTER — Telehealth: Payer: Self-pay | Admitting: Cardiovascular Disease

## 2019-02-05 NOTE — Telephone Encounter (Signed)
Sent My-Chart message

## 2019-02-27 MED ORDER — DILTIAZEM HCL ER 120 MG PO CP24: 120.0000 mg | ORAL_CAPSULE | Freq: Every day | ORAL | 0 refills | Status: DC

## 2019-05-15 ENCOUNTER — Other Ambulatory Visit: Payer: Self-pay

## 2019-05-15 ENCOUNTER — Encounter: Payer: Self-pay | Admitting: Cardiovascular Disease

## 2019-05-15 ENCOUNTER — Ambulatory Visit (INDEPENDENT_AMBULATORY_CARE_PROVIDER_SITE_OTHER): Payer: Medicare Other | Admitting: Cardiovascular Disease

## 2019-05-15 VITALS — BP 141/81 | HR 83 | Ht 71.0 in | Wt 222.6 lb

## 2019-05-15 DIAGNOSIS — I491 Atrial premature depolarization: Secondary | ICD-10-CM | POA: Diagnosis not present

## 2019-05-15 DIAGNOSIS — I429 Cardiomyopathy, unspecified: Secondary | ICD-10-CM | POA: Diagnosis not present

## 2019-05-15 DIAGNOSIS — E782 Mixed hyperlipidemia: Secondary | ICD-10-CM

## 2019-05-15 NOTE — Progress Notes (Signed)
Cardiology Office Note    Date:  05/17/2019   ID:  Paul Huber, DOB 16-Jan-1935, MRN [id number]  PCP:  Eda Paschalavis, Debra L, NP  Cardiologist:   Thurmon FairMihai Namya Voges, MD   Chief Complaint  Patient presents with  . Irregular Heart Beat    History of Present Illness:  Paul Huber is a 84 y.o. male with a history of nonischemic cardiomyopathy and symptomatic PVCs presenting for follow-up.   No cardiac complaints. No real palpitations recently. Has poorly fitting painful dentures and is eating soft foods. At home BP is consistently 120s/70s. Sedentary, without angina or dyspnea. Last year had ETT with poor exercise performance (a little more than 4 minutes on the Bruce protocol), but no ECG changes.  In the remote past he had a depressed left ventricular systolic function measured at 14-7840-45 % in 2003, but repeat echo in 2012 showed normalization of systolic function. Cardiac catheterization in 2003 showed normal coronary arteries. He had symptomatic PACs and PVCs and has been on treatment with diltiazem for a long time.  Twice in the last few years he presented to the office with asymptomatic accelerated junctional rhythm (negative P waves in the inferior leads with a relatively short PR interval).  Today he is in normal sinus rhythm with rare PACs.   Past Medical History:  Diagnosis Date  . Accelerated junctional rhythm   . CAD (coronary artery disease) 05/17/2007   STRESS TEST- EKG negative for ischemia  . Dyslipidemia   . PVC (premature ventricular contraction) 12/07/2010   2D ECHO- EF >55%    Past Surgical History:  Procedure Laterality Date  . CARDIAC CATHETERIZATION Left 07/25/2002   No significant CAD    Current Medications: Outpatient Medications Prior to Visit  Medication Sig Dispense Refill  . diltiazem (DILACOR XR) 120 MG 24 hr capsule Take 1 capsule (120 mg total) by mouth daily. 90 capsule 0  . fish oil-omega-3 fatty acids 1000 MG capsule Take 2 g by mouth daily.    Marland Kitchen.  levothyroxine (SYNTHROID, LEVOTHROID) 137 MCG tablet Take 1 tablet by mouth daily.    . Multiple Vitamin (MULTIVITAMIN) tablet Take 1 tablet by mouth daily.    . sertraline (ZOLOFT) 100 MG tablet Take 100 mg by mouth daily.  11   No facility-administered medications prior to visit.      Allergies:   Patient has no known allergies.   Social History   Socioeconomic History  . Marital status: Married    Spouse name: Not on file  . Number of children: Not on file  . Years of education: Not on file  . Highest education level: Not on file  Occupational History  . Not on file  Social Needs  . Financial resource strain: Not on file  . Food insecurity    Worry: Not on file    Inability: Not on file  . Transportation needs    Medical: Not on file    Non-medical: Not on file  Tobacco Use  . Smoking status: Former Smoker    Types: Pipe  . Smokeless tobacco: Former NeurosurgeonUser    Quit date: 10/02/1979  Substance and Sexual Activity  . Alcohol use: Yes    Comment: rare  . Drug use: No  . Sexual activity: Not on file  Lifestyle  . Physical activity    Days per week: Not on file    Minutes per session: Not on file  . Stress: Not on file  Relationships  . Social connections  Talks on phone: Not on file    Gets together: Not on file    Attends religious service: Not on file    Active member of club or organization: Not on file    Attends meetings of clubs or organizations: Not on file    Relationship status: Not on file  Other Topics Concern  . Not on file  Social History Narrative  . Not on file     Family History:  The patient's family history includes Cancer in his maternal grandmother; Dementia in his mother; Heart attack in his maternal grandfather; Heart disease in his brother; Heart disease (age of onset: 33) in his father; Heart failure in his paternal grandfather.   ROS:   Please see the history of present illness.    ROS All other systems are reviewed and are negative    PHYSICAL EXAM:   VS:  BP (!) 141/81   Pulse 83   Ht 5\' 11"  (1.803 m)   Wt 222 lb 9.6 oz (101 kg)   SpO2 96%   BMI 31.05 kg/m     General: Alert, oriented x3, no distress, mildly obese Head: no evidence of trauma, PERRL, EOMI, no exophtalmos or lid lag, no myxedema, no xanthelasma; normal ears, nose and oropharynx Neck: normal jugular venous pulsations and no hepatojugular reflux; brisk carotid pulses without delay and no carotid bruits Chest: clear to auscultation, no signs of consolidation by percussion or palpation, normal fremitus, symmetrical and full respiratory excursions Cardiovascular: normal position and quality of the apical impulse, regular rhythm, normal first and second heart sounds, no murmurs, rubs or gallops Abdomen: no tenderness or distention, no masses by palpation, no abnormal pulsatility or arterial bruits, normal bowel sounds, no hepatosplenomegaly Extremities: no clubbing, cyanosis or edema; 2+ radial, ulnar and brachial pulses bilaterally; 2+ right femoral, posterior tibial and dorsalis pedis pulses; 2+ left femoral, posterior tibial and dorsalis pedis pulses; no subclavian or femoral bruits Neurological: grossly nonfocal Psych: Normal mood and affect   Wt Readings from Last 3 Encounters:  05/15/19 222 lb 9.6 oz (101 kg)  02/01/18 206 lb 12.8 oz (93.8 kg)  11/10/16 190 lb (86.2 kg)      Studies/Labs Reviewed:   EKG:  EKG is ordered today.  The ekg ordered today demonstrates sinus rhythm with rare PACs, unchanged incomplete LBBB, nonspecific repol changes, QTc 467 ms Recent Labs: We drew a lipid profile today, but he had eaten an avocado sandwich and TG were high  ASSESSMENT:    1. Mixed hyperlipidemia   2. Cardiomyopathy, unspecified type (HCC)   3. PAC (premature atrial contraction)      PLAN:  In order of problems listed above:  1. Accelerated junctional rhythm: Was never symptomatic, normal rhythm today. Has had PACs and PVCs, not syomptomatic  2. History of cardiomyopathy: When we last evaluated left ventricular systolic function his EF had returned to normal.  He is sedentary. No signs of hypervolemia, does not need diuretics. 3. Hyperlipidemia: he prefers not to take meds. Labs today were not edifiying since he was not fasting, but his cholesterol is lower than before.    Medication Adjustments/Labs and Tests Ordered: Current medicines are reviewed at length with the patient today.  Concerns regarding medicines are outlined above.  Medication changes, Labs and Tests ordered today are listed in the Patient Instructions below. Patient Instructions  Medication Instructions:  Your physician recommends that you continue on your current medications as directed. Please refer to the Current Medication list given  to you today.  If you need a refill on your cardiac medications before your next appointment, please call your pharmacy.   Lab work: Your provider would like for you to have the following labs today: Lipid and CMET  If you have labs (blood work) drawn today and your tests are completely normal, you will receive your results only by: [place] (if you have MyChart) OR A paper copy in the mail If you have any lab test that is abnormal or we need to change your treatment, we will call you to review the results.  Testing/Procedures: None ordered  Follow-Up: At [facility], you and your health needs are our priority.  As part of our continuing mission to provide you with exceptional heart care, we have created designated Provider Care Teams.  These Care Teams include your primary Cardiologist (physician) and Advanced Practice Providers (APPs -  Physician Assistants and Nurse Practitioners) who all work together to provide you with the care you need, when you need it. You will need a follow up appointment in 12 months.  Please call our office 2 months in advance to schedule this appointment.  You may see Sanda Klein, MD  or one of the following Advanced Practice Providers on your designated Care Team: Almyra Deforest, PA-C Fabian Sharp, [place]         Signed, Sanda Klein, MD  05/17/2019 10:42 PM    [address] [place], [place], [place]  [address] Phone: [phone]; Fax: [phone]

## 2019-05-15 NOTE — Patient Instructions (Signed)
Medication Instructions:  Your physician recommends that you continue on your current medications as directed. Please refer to the Current Medication list given to you today.  If you need a refill on your cardiac medications before your next appointment, please call your pharmacy.   Lab work: Your provider would like for you to have the following labs today: Lipid and CMET  If you have labs (blood work) drawn today and your tests are completely normal, you will receive your results only by: MyChart Message (if you have MyChart) OR A paper copy in the mail If you have any lab test that is abnormal or we need to change your treatment, we will call you to review the results.  Testing/Procedures: None ordered   Follow-Up: At [facility], you and your health needs are our priority.  As part of our continuing mission to provide you with exceptional heart care, we have created designated Provider Care Teams.  These Care Teams include your primary Cardiologist (physician) and Advanced Practice Providers (APPs -  Physician Assistants and Nurse Practitioners) who all work together to provide you with the care you need, when you need it. You will need a follow up appointment in 12 months.  Please call our office 2 months in advance to schedule this appointment.  You may see Mihai Croitoru, MD or one of the following Advanced Practice Providers on your designated Care Team: Hao Meng, PA-C Angela Duke, PA-C       

## 2019-05-16 LAB — COMPREHENSIVE METABOLIC PANEL
ALT: 22 IU/L (ref 0–44)
AST: 20 IU/L (ref 0–40)
Albumin/Globulin Ratio: 1.8 (ref 1.2–2.2)
Albumin: 4.3 g/dL (ref 3.6–4.6)
Alkaline Phosphatase: 65 IU/L (ref 39–117)
BUN/Creatinine Ratio: 12 (ref 10–24)
BUN: 13 mg/dL (ref 8–27)
Bilirubin Total: 0.4 mg/dL (ref 0.0–1.2)
CO2: 26 mmol/L (ref 20–29)
Calcium: 9 mg/dL (ref 8.6–10.2)
Chloride: 105 mmol/L (ref 96–106)
Creatinine, Ser: 1.13 mg/dL (ref 0.76–1.27)
GFR calc Af Amer: 70 mL/min/{1.73_m2} (ref >59)
GFR calc non Af Amer: 60 mL/min/{1.73_m2} (ref >59)
Globulin, Total: 2.4 g/dL (ref 1.5–4.5)
Glucose: 95 mg/dL (ref 65–99)
Potassium: 4.3 mmol/L (ref 3.5–5.2)
Sodium: 145 mmol/L — ABNORMAL HIGH (ref 134–144)
Total Protein: 6.7 g/dL (ref 6.0–8.5)

## 2019-05-16 LAB — LIPID PANEL
Chol/HDL Ratio: 7 ratio — ABNORMAL HIGH (ref 0.0–5.0)
Cholesterol, Total: 188 mg/dL (ref 100–199)
HDL: 27 mg/dL — ABNORMAL LOW (ref >39)
Triglycerides: 412 mg/dL — ABNORMAL HIGH (ref 0–149)

## 2019-05-17 ENCOUNTER — Encounter: Payer: Self-pay | Admitting: Cardiovascular Disease

## 2019-06-02 ENCOUNTER — Other Ambulatory Visit: Payer: Self-pay

## 2019-06-02 MED ORDER — DILTIAZEM HCL ER 120 MG PO CP24: 120.0000 mg | ORAL_CAPSULE | Freq: Every day | ORAL | 1 refills | Status: DC

## 2019-12-01 ENCOUNTER — Other Ambulatory Visit: Payer: Self-pay | Admitting: *Deleted

## 2019-12-01 MED ORDER — DILTIAZEM HCL ER 120 MG PO CP24: 120.0000 mg | ORAL_CAPSULE | Freq: Every day | ORAL | 1 refills | Status: DC

## 2020-06-04 ENCOUNTER — Other Ambulatory Visit: Payer: Self-pay

## 2020-06-04 ENCOUNTER — Other Ambulatory Visit: Payer: Self-pay | Admitting: Cardiovascular Disease

## 2020-08-09 ENCOUNTER — Other Ambulatory Visit: Payer: Self-pay | Admitting: Cardiovascular Disease

## 2020-08-12 ENCOUNTER — Encounter: Payer: Self-pay | Admitting: Cardiovascular Disease

## 2020-08-12 ENCOUNTER — Ambulatory Visit (INDEPENDENT_AMBULATORY_CARE_PROVIDER_SITE_OTHER): Payer: Medicare Other | Admitting: Cardiovascular Disease

## 2020-08-12 VITALS — BP 126/78 | HR 83 | Ht 71.0 in | Wt 224.0 lb

## 2020-08-12 DIAGNOSIS — R002 Palpitations: Secondary | ICD-10-CM

## 2020-08-12 DIAGNOSIS — R0602 Shortness of breath: Secondary | ICD-10-CM | POA: Diagnosis not present

## 2020-08-12 DIAGNOSIS — I498 Other specified cardiac arrhythmias: Secondary | ICD-10-CM | POA: Diagnosis not present

## 2020-08-12 DIAGNOSIS — E782 Mixed hyperlipidemia: Secondary | ICD-10-CM | POA: Diagnosis not present

## 2020-08-12 DIAGNOSIS — I429 Cardiomyopathy, unspecified: Secondary | ICD-10-CM

## 2020-08-12 NOTE — Patient Instructions (Signed)
Medication Instructions:  No changes *If you need a refill on your cardiac medications before your next appointment, please call your pharmacy*   Lab Work: Your provider would like for you to return within a few weeks to have the following labs drawn: fasting Lipid. You do not need an appointment for the lab. Once in our office lobby there is a podium where you can sign in and ring the doorbell to alert [place] that you are here. The lab is open from 8:00 am to 4:30 pm; closed for lunch from 12:45pm-1:45pm.  If you have labs (blood work) drawn today and your tests are completely normal, you will receive your results only by: Marland Kitchen MyChart Message (if you have MyChart) OR . A paper copy in the mail If you have any lab test that is abnormal or we need to change your treatment, we will call you to review the results.   Testing/Procedures: Your physician has requested that you have an echocardiogram. Echocardiography is a painless test that uses sound waves to create images of your heart. It provides your doctor with information about the size and shape of your heart and how well your heart's chambers and valves are working. You may receive an ultrasound enhancing agent through an IV if needed to better visualize your heart during the echo.This procedure takes approximately one hour. There are no restrictions for this procedure. This will take place at the [address], [address].   Follow-Up: At [facility], you and your health needs are our priority.  As part of our continuing mission to provide you with exceptional heart care, we have created designated Provider Care Teams.  These Care Teams include your primary Cardiologist (physician) and Advanced Practice Providers (APPs -  Physician Assistants and Nurse Practitioners) who all work together to provide you with the care you need, when you need it.  We recommend signing up for the patient portal called "MyChart".  Sign up information is provided  on this After Visit Summary.  MyChart is used to connect with patients for Virtual Visits (Telemedicine).  Patients are able to view lab/test results, encounter notes, upcoming appointments, etc.  Non-urgent messages can be sent to your provider as well.   To learn more about what you can do with MyChart, go to [email or web address].    Your next appointment:   12 month(s)  The format for your next appointment:   In Person  Provider:   You may see Thurmon Fair, MD or one of the following Advanced Practice Providers on your designated Care Team:    Azalee Course, PA-C  Micah Flesher, PA-C or   Judy Pimple, [place]

## 2020-08-12 NOTE — Progress Notes (Signed)
Cardiology Office Note    Date:  08/13/2020   ID:  Paul Huber, DOB Oct 04, 1934, MRN [id number]  PCP:  Eda Paschal, NP  Cardiologist:   Thurmon Fair, MD   Chief Complaint  Patient presents with  . Shortness of Breath    History of Present Illness:  Paul Huber is a 85 y.o. male with a history of nonischemic cardiomyopathy and symptomatic PVCs presenting for follow-up.   He has generally been doing well but believes that his exercise tolerance has decreased somewhat.  He has gained substantial weight during the pandemic.  He is working exclusively from home.  He denies angina either at rest or with physical activity.  He has hip arthritis which limits his ability to walk long distances, but still developed some shortness of breath.  He does not have orthopnea or PND.  He does occasionally develop mild ankle edema towards the end of the day.  He has not had any palpitations.  He denies any focal neurological complaints or syncope.  A couple of years ago he underwent an exercise treadmill stress test that showed poor exercise tolerance (just over 4 minutes on the Bruce protocol), but no ischemic changes.  He has not had an echocardiogram since 2012 when his LVEF was normal (previously had LVEF 40-45% by echo in 2003.  He had normal coronary arteries by angiography in 2003.  Diltiazem controls palpitations well (previous monitoring are showing PACs and PVCs).  On a couple of occasions he has presented to the office with asymptomatic accelerated junctional rhythm (negative inferior lead P waves with a short PR interval).  Today he is in normal sinus rhythm.    Past Medical History:  Diagnosis Date  . Accelerated junctional rhythm   . CAD (coronary artery disease) 05/17/2007   STRESS TEST- EKG negative for ischemia  . Dyslipidemia   . PVC (premature ventricular contraction) 12/07/2010   2D ECHO- EF >55%    Past Surgical History:  Procedure Laterality Date  . CARDIAC  CATHETERIZATION Left 07/25/2002   No significant CAD    Current Medications: Outpatient Medications Prior to Visit  Medication Sig Dispense Refill  . DILT-XR 120 MG 24 hr capsule TAKE 1 CAPSULE(120 MG) BY MOUTH DAILY (Patient not taking: Reported on 08/12/2020) 30 capsule 1  . levothyroxine (SYNTHROID, LEVOTHROID) 137 MCG tablet Take 1 tablet by mouth daily.    . Multiple Vitamin (MULTIVITAMIN) tablet Take 1 tablet by mouth daily. (Patient not taking: Reported on 08/12/2020)    . sertraline (ZOLOFT) 100 MG tablet Take 100 mg by mouth daily. (Patient not taking: Reported on 08/12/2020)  11  . fish oil-omega-3 fatty acids 1000 MG capsule Take 2 g by mouth daily.     No facility-administered medications prior to visit.     Allergies:   Patient has no known allergies.   Social History   Socioeconomic History  . Marital status: Married    Spouse name: Not on file  . Number of children: Not on file  . Years of education: Not on file  . Highest education level: Not on file  Occupational History  . Not on file  Tobacco Use  . Smoking status: Former Smoker    Types: Pipe  . Smokeless tobacco: Former Neurosurgeon    Quit date: 10/02/1979  Substance and Sexual Activity  . Alcohol use: Yes    Comment: rare  . Drug use: No  . Sexual activity: Not on file  Other Topics Concern  .  Not on file  Social History Narrative  . Not on file   Social Determinants of Health   Financial Resource Strain:   . Difficulty of Paying Living Expenses: Not on file  Food Insecurity:   . Worried About Programme researcher, broadcasting/film/video in the Last Year: Not on file  . Ran Out of Food in the Last Year: Not on file  Transportation Needs:   . Lack of Transportation (Medical): Not on file  . Lack of Transportation (Non-Medical): Not on file  Physical Activity:   . Days of Exercise per Week: Not on file  . Minutes of Exercise per Session: Not on file  Stress:   . Feeling of Stress : Not on file  Social Connections:   .  Frequency of Communication with Friends and Family: Not on file  . Frequency of Social Gatherings with Friends and Family: Not on file  . Attends Religious Services: Not on file  . Active Member of Clubs or Organizations: Not on file  . Attends Banker Meetings: Not on file  . Marital Status: Not on file     Family History:  The patient's family history includes Cancer in his maternal grandmother; Dementia in his mother; Heart attack in his maternal grandfather; Heart disease in his brother; Heart disease (age of onset: 86) in his father; Heart failure in his paternal grandfather.   ROS:   Please see the history of present illness.    ROS All other systems are reviewed and are negative   PHYSICAL EXAM:   VS:  BP 126/78 (BP Location: Left Arm, Patient Position: Sitting)   Pulse 83   Ht 5\' 11"  (1.803 m)   Wt 224 lb (101.6 kg)   SpO2 95%   BMI 31.24 kg/m     General: Alert, oriented x3, no distress. Mildly obese with prominent abdominal obesity Head: no evidence of trauma, PERRL, EOMI, no exophtalmos or lid lag, no myxedema, no xanthelasma; normal ears, nose and oropharynx Neck: normal jugular venous pulsations and no hepatojugular reflux; brisk carotid pulses without delay and no carotid bruits Chest: clear to auscultation, no signs of consolidation by percussion or palpation, normal fremitus, symmetrical and full respiratory excursions Cardiovascular: normal position and quality of the apical impulse, regular rhythm, normal first and second heart sounds, no murmurs, rubs or gallops Abdomen: no tenderness or distention, no masses by palpation, no abnormal pulsatility or arterial bruits, normal bowel sounds, no hepatosplenomegaly Extremities: no clubbing, cyanosis or edema; 2+ radial, ulnar and brachial pulses bilaterally; 2+ right femoral, posterior tibial and dorsalis pedis pulses; 2+ left femoral, posterior tibial and dorsalis pedis pulses; no subclavian or femoral  bruits Neurological: grossly nonfocal Psych: Normal mood and affect   Wt Readings from Last 3 Encounters:  08/12/20 224 lb (101.6 kg)  05/15/19 222 lb 9.6 oz (101 kg)  02/01/18 206 lb 12.8 oz (93.8 kg)      Studies/Labs Reviewed:   EKG:  EKG is ordered today.  Shows sinus rhythm with mild QRS prolongation (incomplete left bundle branch block), QTC 460 ms, unchanged from previous tracings. Recent Labs: We drew a lipid profile last year, but he had eaten an avocado sandwich and TG were high December 2020 labs at [facility] showed total cholesterol 208, HDL 33, LDL 118, triglycerides 297, creatinine 1.1, TSH 2.39  ASSESSMENT:    1. Shortness of breath   2. Cardiomyopathy, unspecified type (HCC)   3. Mixed hyperlipidemia   4. Accelerated junctional rhythm   5.  Palpitations      PLAN:  In order of problems listed above:  1. Dyspnea: Recheck echocardiogram.  Physical exam does not show signs of hypervolemia today.   2. History of cardiomyopathy: When we last evaluated left ventricular systolic function his EF had returned to normal.  Has incomplete left bundle branch block on ECG, unchanged. 3. Hyperlipidemia: Recheck labs when he is fasting.  Encourage weight loss and a healthier diet. 4. Accelerated junctional rhythm: Detected in the past, asymptomatic. 5. PACs/PVCs: Palpitations have been symptomatically well controlled with low-dose diltiazem.    Medication Adjustments/Labs and Tests Ordered: Current medicines are reviewed at length with the patient today.  Concerns regarding medicines are outlined above.  Medication changes, Labs and Tests ordered today are listed in the Patient Instructions below. Patient Instructions  Medication Instructions:  No changes *If you need a refill on your cardiac medications before your next appointment, please call your pharmacy*   Lab Work: Your provider would like for you to return within a few weeks to have the following labs drawn: fasting  Lipid. You do not need an appointment for the lab. Once in our office lobby there is a podium where you can sign in and ring the doorbell to alert [place] that you are here. The lab is open from 8:00 am to 4:30 pm; closed for lunch from 12:45pm-1:45pm.  If you have labs (blood work) drawn today and your tests are completely normal, you will receive your results only by: Marland Kitchen MyChart Message (if you have MyChart) OR . A paper copy in the mail If you have any lab test that is abnormal or we need to change your treatment, we will call you to review the results.   Testing/Procedures: Your physician has requested that you have an echocardiogram. Echocardiography is a painless test that uses sound waves to create images of your heart. It provides your doctor with information about the size and shape of your heart and how well your heart's chambers and valves are working. You may receive an ultrasound enhancing agent through an IV if needed to better visualize your heart during the echo.This procedure takes approximately one hour. There are no restrictions for this procedure. This will take place at the [address], [address].   Follow-Up: At [facility], you and your health needs are our priority.  As part of our continuing mission to provide you with exceptional heart care, we have created designated Provider Care Teams.  These Care Teams include your primary Cardiologist (physician) and Advanced Practice Providers (APPs -  Physician Assistants and Nurse Practitioners) who all work together to provide you with the care you need, when you need it.  We recommend signing up for the patient portal called "MyChart".  Sign up information is provided on this After Visit Summary.  MyChart is used to connect with patients for Virtual Visits (Telemedicine).  Patients are able to view lab/test results, encounter notes, upcoming appointments, etc.  Non-urgent messages can be sent to your provider as well.   To learn  more about what you can do with MyChart, go to [email or web address].    Your next appointment:   12 month(s)  The format for your next appointment:   In Person  Provider:   You may see Thurmon Fair, MD or one of the following Advanced Practice Providers on your designated Care Team:    Azalee Course, PA-C  Micah Flesher, PA-C or   Judy Pimple, [place]  Signed, Thurmon Fair, MD  08/13/2020 7:31 AM    [facility] [address] [place], [place], [place]  [address] Phone: [phone]; Fax: [phone]

## 2020-08-13 ENCOUNTER — Encounter: Payer: Self-pay | Admitting: Cardiovascular Disease

## 2020-09-02 ENCOUNTER — Ambulatory Visit (HOSPITAL_COMMUNITY): Payer: Medicare Other | Attending: Cardiovascular Disease

## 2020-09-02 ENCOUNTER — Other Ambulatory Visit: Payer: Self-pay

## 2020-09-02 DIAGNOSIS — R0602 Shortness of breath: Secondary | ICD-10-CM | POA: Diagnosis present

## 2020-09-02 LAB — LIPID PANEL
Chol/HDL Ratio: 6.5 ratio — ABNORMAL HIGH (ref 0.0–5.0)
Cholesterol, Total: 221 mg/dL — ABNORMAL HIGH (ref 100–199)
HDL: 34 mg/dL — ABNORMAL LOW (ref >39)
LDL Chol Calc (NIH): 147 mg/dL — ABNORMAL HIGH (ref 0–99)
Triglycerides: 219 mg/dL — ABNORMAL HIGH (ref 0–149)
VLDL Cholesterol Cal: 40 mg/dL (ref 5–40)

## 2020-09-02 LAB — ECHOCARDIOGRAM COMPLETE
Area-P 1/2: 2.97 cm2
S' Lateral: 2.95 cm

## 2020-09-09 DIAGNOSIS — E782 Mixed hyperlipidemia: Secondary | ICD-10-CM

## 2020-09-09 MED ORDER — ATORVASTATIN CALCIUM 20 MG PO TABS: 20.0000 mg | ORAL_TABLET | Freq: Every day | ORAL | 3 refills | Status: DC

## 2020-09-09 NOTE — Telephone Encounter (Signed)
Yes, please repeat in 3 months

## 2020-10-08 ENCOUNTER — Other Ambulatory Visit: Payer: Self-pay | Admitting: Cardiovascular Disease

## 2020-12-10 LAB — LIPID PANEL
Chol/HDL Ratio: 4.4 ratio (ref 0.0–5.0)
Cholesterol, Total: 136 mg/dL (ref 100–199)
HDL: 31 mg/dL — ABNORMAL LOW (ref >39)
LDL Chol Calc (NIH): 72 mg/dL (ref 0–99)
Triglycerides: 199 mg/dL — ABNORMAL HIGH (ref 0–149)
VLDL Cholesterol Cal: 33 mg/dL (ref 5–40)

## 2021-09-01 ENCOUNTER — Other Ambulatory Visit: Payer: Self-pay | Admitting: Cardiovascular Disease

## 2021-10-14 ENCOUNTER — Encounter: Payer: Self-pay | Admitting: Cardiovascular Disease

## 2021-10-14 ENCOUNTER — Ambulatory Visit: Payer: Medicare Other | Admitting: Cardiovascular Disease

## 2021-10-14 ENCOUNTER — Other Ambulatory Visit: Payer: Self-pay

## 2021-10-14 VITALS — BP 104/72 | HR 66 | Ht 72.0 in | Wt 215.0 lb

## 2021-10-14 DIAGNOSIS — Z8679 Personal history of other diseases of the circulatory system: Secondary | ICD-10-CM

## 2021-10-14 DIAGNOSIS — I493 Ventricular premature depolarization: Secondary | ICD-10-CM | POA: Diagnosis not present

## 2021-10-14 DIAGNOSIS — I498 Other specified cardiac arrhythmias: Secondary | ICD-10-CM | POA: Diagnosis not present

## 2021-10-14 DIAGNOSIS — E782 Mixed hyperlipidemia: Secondary | ICD-10-CM

## 2021-10-14 NOTE — Patient Instructions (Signed)

## 2021-10-14 NOTE — Progress Notes (Signed)
Cardiology Office Note    Date:  10/14/2021   ID:  Paul Huber, DOB [birth year], MRN [id number]  PCP:  Lindwood Qua, MD  Cardiologist:   Thurmon Fair, MD   Chief Complaint  Patient presents with   Follow-up    12 months.   Irregular Heart Beat    History of Present Illness:  Paul Huber is a 87 y.o. male with a history of nonischemic cardiomyopathy and symptomatic PVCs presenting for follow-up.   He has done quite well over the last year.  At the beginning of 2022 she had some problems with falls and general instability.  He has started walking on a regular basis he has not had any falls in the last 6 months.  He has lost some weight and his BMI is now in the overweight, rather than obese range.  He denies any problems with chest pain or shortness of breath at rest or with activity.  He does not have lower extremity edema (except rarely, only at the end of the day), palpitations, dizziness, syncope, focal neurological events.  His most recent lipid profile shows an LDL of 72, but also a chronically low HDL at 31.  His most recent echocardiogram from December 2021 showed normal left ventricular systolic function (3D calculated EF 61%) and no other significant structural abnormalities).  In 2019 he had a normal treadmill stress test (but did have poor exercise tolerance at just over 4 minutes)  Previously had LVEF 40-45% by echo in 2003.  He had normal coronary arteries by angiography in 2003.  Diltiazem controls palpitations well (previous monitoring showed PACs and PVCs).  On a couple of occasions he has presented to the office with asymptomatic accelerated junctional rhythm (negative inferior lead P waves with a short PR interval).  Today he is in normal sinus rhythm.    Past Medical History:  Diagnosis Date   Accelerated junctional rhythm    CAD (coronary artery disease) 05/17/2007   STRESS TEST- EKG negative for ischemia   Dyslipidemia    PVC (premature ventricular  contraction) 12/07/2010   2D ECHO- EF >55%    Past Surgical History:  Procedure Laterality Date   CARDIAC CATHETERIZATION Left 07/25/2002   No significant CAD    Current Medications: Outpatient Medications Prior to Visit  Medication Sig Dispense Refill   atorvastatin (LIPITOR) 20 MG tablet TAKE ONE TABLET BY MOUTH EVERY DAY 54 tablet 0   DILT-XR 120 MG 24 hr capsule TAKE 1 CAPSULE(120 MG) BY MOUTH DAILY 30 capsule 11   levothyroxine (SYNTHROID, LEVOTHROID) 137 MCG tablet Take 1 tablet by mouth daily.     sertraline (ZOLOFT) 100 MG tablet Take 100 mg by mouth daily.  11   Multiple Vitamin (MULTIVITAMIN) tablet Take 1 tablet by mouth daily. (Patient not taking: Reported on 08/12/2020)     No facility-administered medications prior to visit.     Allergies:   Patient has no known allergies.   Social History   Socioeconomic History   Marital status: Married    Spouse name: Not on file   Number of children: Not on file   Years of education: Not on file   Highest education level: Not on file  Occupational History   Not on file  Tobacco Use   Smoking status: Former    Types: Pipe   Smokeless tobacco: Former    Quit date: 10/02/1979  Substance and Sexual Activity   Alcohol use: Yes    Comment: rare  Drug use: No   Sexual activity: Not on file  Other Topics Concern   Not on file  Social History Narrative   Not on file   Social Determinants of Health   Financial Resource Strain: Not on file  Food Insecurity: Not on file  Transportation Needs: Not on file  Physical Activity: Not on file  Stress: Not on file  Social Connections: Not on file     Family History:  The patient's family history includes Cancer in his maternal grandmother; Dementia in his mother; Heart attack in his maternal grandfather; Heart disease in his brother; Heart disease (age of onset: 63) in his father; Heart failure in his paternal grandfather.   ROS:   Please see the history of present illness.     ROS All other systems are reviewed and are negative   PHYSICAL EXAM:   VS:  BP 104/72 (BP Location: Left Arm, Patient Position: Sitting, Cuff Size: Normal)    Pulse 66    Ht 6' (1.829 m)    Wt 215 lb (97.5 kg)    BMI 29.16 kg/m     General: Alert, oriented x3, no distress, overweight with most of his weight distributed in the abdominal area Head: no evidence of trauma, PERRL, EOMI, no exophtalmos or lid lag, no myxedema, no xanthelasma; normal ears, nose and oropharynx Neck: normal jugular venous pulsations and no hepatojugular reflux; brisk carotid pulses without delay and no carotid bruits Chest: clear to auscultation, no signs of consolidation by percussion or palpation, normal fremitus, symmetrical and full respiratory excursions Cardiovascular: normal position and quality of the apical impulse, regular rhythm, normal first and second heart sounds, no murmurs, rubs or gallops Abdomen: no tenderness or distention, no masses by palpation, no abnormal pulsatility or arterial bruits, normal bowel sounds, no hepatosplenomegaly Extremities: no clubbing, cyanosis or edema; 2+ radial, ulnar and brachial pulses bilaterally; 2+ right femoral, posterior tibial and dorsalis pedis pulses; 2+ left femoral, posterior tibial and dorsalis pedis pulses; no subclavian or femoral bruits Neurological: grossly nonfocal Psych: Normal mood and affect    Wt Readings from Last 3 Encounters:  10/14/21 215 lb (97.5 kg)  08/12/20 224 lb (101.6 kg)  05/15/19 222 lb 9.6 oz (101 kg)      Studies/Labs Reviewed:   ECHO 09/02/2020:   1. Left ventricular ejection fraction, by estimation, is 60 to 65%. Left  ventricular ejection fraction by 3D volume is 61 %. The left ventricle has  normal function. The left ventricle has no regional wall motion  abnormalities. Left ventricular diastolic   parameters are consistent with Grade I diastolic dysfunction (impaired  relaxation).   2. Right ventricular systolic  function is normal. The right ventricular  size is normal. There is normal pulmonary artery systolic pressure. The  estimated right ventricular systolic pressure is 23.4 mmHg.   3. The mitral valve is grossly normal. Trivial mitral valve  regurgitation. No evidence of mitral stenosis.   4. The aortic valve is tricuspid. Aortic valve regurgitation is not  visualized. No aortic stenosis is present.   5. The inferior vena cava is normal in size with greater than 50%  respiratory variability, suggesting right atrial pressure of 3 mmHg.   EKG:  EKG is ordered today.  Shows sinus rhythm with a single PAC, borderline criteria for full LBBB (QRS 118 ms), QTC 440 ms.  Similar to previous tracings.   We drew a lipid profile last year, but he had eaten an avocado sandwich and TG were  high December 2020 labs at [facility] showed total cholesterol 208, HDL 33, LDL 118, triglycerides 297, creatinine 1.1, TSH 2.39  ASSESSMENT:    1. History of cardiomyopathy   2. Mixed hyperlipidemia   3. Accelerated junctional rhythm   4. PVC's (premature ventricular contractions)      PLAN:  In order of problems listed above:   History of cardiomyopathy: It is possible that he had transient myocarditis or PVC cardiomyopathy in 2003.  Left ventricular systolic function is now completely normal.    Has incomplete left bundle branch block on ECG, unchanged. Hyperlipidemia: LDL in target range, but HDL still quite low.  Encouraged more physical activity (walking will also help his balance) and continued efforts at losing some weight. Accelerated junctional rhythm: Detected in the past, asymptomatic.  In normal sinus rhythm today. PACs/PVCs: Symptomatically very well controlled on the current low-dose of diltiazem.    Medication Adjustments/Labs and Tests Ordered: Current medicines are reviewed at length with the patient today.  Concerns regarding medicines are outlined above.  Medication changes, Labs and Tests ordered  today are listed in the Patient Instructions below. Patient Instructions  Medication Instructions:  No changes *If you need a refill on your cardiac medications before your next appointment, please call your pharmacy*   Lab Work: None ordered If you have labs (blood work) drawn today and your tests are completely normal, you will receive your results only by: MyChart Message (if you have MyChart) OR A paper copy in the mail If you have any lab test that is abnormal or we need to change your treatment, we will call you to review the results.   Testing/Procedures: None ordered   Follow-Up: At [facility], you and your health needs are our priority.  As part of our continuing mission to provide you with exceptional heart care, we have created designated Provider Care Teams.  These Care Teams include your primary Cardiologist (physician) and Advanced Practice Providers (APPs -  Physician Assistants and Nurse Practitioners) who all work together to provide you with the care you need, when you need it.  We recommend signing up for the patient portal called "MyChart".  Sign up information is provided on this After Visit Summary.  MyChart is used to connect with patients for Virtual Visits (Telemedicine).  Patients are able to view lab/test results, encounter notes, upcoming appointments, etc.  Non-urgent messages can be sent to your provider as well.   To learn more about what you can do with MyChart, go to [email or web address].    Your next appointment:   12 month(s)  The format for your next appointment:   In Person  Provider:   Thurmon Fair, MD {     Signed, Thurmon Fair, MD  10/14/2021 10:43 AM    [facility] [address] [place], [place], [place]  [address] Phone: [phone]; Fax: [phone]

## 2021-11-04 ENCOUNTER — Other Ambulatory Visit: Payer: Self-pay | Admitting: Cardiovascular Disease

## 2022-11-08 ENCOUNTER — Other Ambulatory Visit: Payer: Self-pay | Admitting: Cardiovascular Disease

## 2023-03-15 NOTE — Progress Notes (Signed)
   Cardiology Clinic Note   Date: 03/16/2023 ID: [name], DOB 05-01-1935, MRN [id number]  Primary Cardiologist:  Thurmon Fair, MD  Patient Profile    Paul Huber is a 88 y.o. male who presents to the clinic today for overdue follow up.     Past medical history significant for: Cardiomyopathy. LHC 07/25/2002: No significant CAD.  Mildly depressed LV systolic function. Echo 07/25/2002: LVEF 40-45%. Echo 09/02/2020: EF 60 to 65% Grade I DD.  Normal RV function. PVCs. Accelerated junctional rhythm. Hyperlipidemia.     History of Present Illness    Paul Huber is a longtime patient of cardiology.  He is followed by Dr. Royann Shivers for the above outlined history.  Last seen in the office by Dr. Royann Shivers on 10/14/2021 for routine follow-up.  He was doing well at that time and no changes were made.  Today, patient is accompanied by his wife. He is doing well. Patient denies shortness of breath or dyspnea on exertion. No chest pain, pressure, or tightness. Denies lower extremity edema, orthopnea, or PND. No palpitations. He continues to work from home full time. He walks about 2000 steps per day. His activity is limited by left hip and back pain. He has an upcoming appointment with ortho. He is trying to avoid surgery but would like to regain mobility so he can be more active. Based on his exam today he would be an acceptable risk for surgery without further cardiac testing.    ROS: All other systems reviewed and are otherwise negative except as noted in History of Present Illness.  Studies Reviewed    ECG personally reviewed by me today: NSR, incomplete LBBB, 76 bpm.  No significant changes from 10/14/2021.      Physical Exam    VS:  BP 122/60 (BP Location: Left Arm, Patient Position: Sitting, Cuff Size: Normal)   Pulse 76   Ht 5\' 11"  (1.803 m)   Wt 221 lb 3.2 oz (100.3 kg)   SpO2 95%   BMI 30.85 kg/m  , BMI Body mass index is 30.85 kg/m.  GEN: Well nourished, well  developed, in no acute distress. Neck: No JVD or carotid bruits. Cardiac:  RRR. No murmurs. No rubs or gallops.   Respiratory:  Respirations regular and unlabored. Clear to auscultation without rales, wheezing or rhonchi. GI: Soft, nontender, nondistended. Extremities: Radials/DP/PT 2+ and equal bilaterally. No clubbing or cyanosis. No edema.  Skin: Warm and dry, no rash. Neuro: Strength intact.  Assessment & Plan    Cardiomyopathy.  Mildly depressed LV systolic function without significant CAD was found on LHC in October 2003.  Echo December 2021 showed normal LV/RV function with Grade I DD.  Patient denies lower extremity edema, shortness of breath, DOE, orthopnea or PND. Euvolemic and well compensated on exam.  PVCs.  Patient denies palpitations. EKG today shows NSR with incomplete LBBB. RRR on auscultation. Continue diltiazem. Hyperlipidemia. Patient has not had his lipids checked since 2022. He will get labs drawn sometime next week when he is fasting. Continue atorvastatin.   Disposition: Lipid panel and CMP when fasting. Return in 6 months or sooner as needed.          Signed, Etta Grandchild. Errol Ala, DNP, NP-C

## 2023-03-16 ENCOUNTER — Encounter: Payer: Self-pay | Admitting: Student

## 2023-03-16 ENCOUNTER — Ambulatory Visit: Payer: Medicare Other | Attending: Student | Admitting: Student

## 2023-03-16 VITALS — BP 122/60 | HR 76 | Ht 71.0 in | Wt 221.2 lb

## 2023-03-16 DIAGNOSIS — I493 Ventricular premature depolarization: Secondary | ICD-10-CM

## 2023-03-16 DIAGNOSIS — E782 Mixed hyperlipidemia: Secondary | ICD-10-CM

## 2023-03-16 DIAGNOSIS — I429 Cardiomyopathy, unspecified: Secondary | ICD-10-CM

## 2023-03-16 NOTE — Patient Instructions (Signed)
Medication Instructions:  NO CHANGES  *If you need a refill on your cardiac medications before your next appointment, please call your pharmacy*   Lab Work: CMET and Lipid Panel -- fasting  [facility] [address] [place], [place] [address] [address] [place], [place] [address]   If you have labs (blood work) drawn today and your tests are completely normal, you will receive your results only by: MyChart Message (if you have MyChart) OR A paper copy in the mail If you have any lab test that is abnormal or we need to change your treatment, we will call you to review the results.   Follow-Up: At [facility], you and your health needs are our priority.  As part of our continuing mission to provide you with exceptional heart care, we have created designated Provider Care Teams.  These Care Teams include your primary Cardiologist (physician) and Advanced Practice Providers (APPs -  Physician Assistants and Nurse Practitioners) who all work together to provide you with the care you need, when you need it.  We recommend signing up for the patient portal called "MyChart".  Sign up information is provided on this After Visit Summary.  MyChart is used to connect with patients for Virtual Visits (Telemedicine).  Patients are able to view lab/test results, encounter notes, upcoming appointments, etc.  Non-urgent messages can be sent to your provider as well.   To learn more about what you can do with MyChart, go to [email or web address].    Your next appointment:    6 months with Dr. Royann Shivers Please call as soon as able to schedule this appointment

## 2023-03-23 LAB — COMPREHENSIVE METABOLIC PANEL
ALT: 22 IU/L (ref 0–44)
AST: 24 IU/L (ref 0–40)
Albumin: 4.6 g/dL (ref 3.7–4.7)
Alkaline Phosphatase: 62 IU/L (ref 44–121)
BUN/Creatinine Ratio: 19 (ref 10–24)
BUN: 20 mg/dL (ref 8–27)
Bilirubin Total: 0.4 mg/dL (ref 0.0–1.2)
CO2: 25 mmol/L (ref 20–29)
Calcium: 9.5 mg/dL (ref 8.6–10.2)
Chloride: 108 mmol/L — ABNORMAL HIGH (ref 96–106)
Creatinine, Ser: 1.08 mg/dL (ref 0.76–1.27)
Globulin, Total: 2.3 g/dL (ref 1.5–4.5)
Glucose: 105 mg/dL — ABNORMAL HIGH (ref 70–99)
Potassium: 4.3 mmol/L (ref 3.5–5.2)
Sodium: 146 mmol/L — ABNORMAL HIGH (ref 134–144)
Total Protein: 6.9 g/dL (ref 6.0–8.5)
eGFR: 67 mL/min/{1.73_m2} (ref >59)

## 2023-03-23 LAB — LIPID PANEL
Chol/HDL Ratio: 3.2 ratio (ref 0.0–5.0)
Cholesterol, Total: 138 mg/dL (ref 100–199)
HDL: 43 mg/dL (ref >39)
LDL Chol Calc (NIH): 71 mg/dL (ref 0–99)
Triglycerides: 135 mg/dL (ref 0–149)
VLDL Cholesterol Cal: 24 mg/dL (ref 5–40)

## 2023-08-21 ENCOUNTER — Telehealth: Payer: Self-pay | Admitting: *Deleted

## 2023-08-21 NOTE — Telephone Encounter (Signed)
   Name: Paul Huber  DOB: Jun 04, 1935  MRN: [id number]  Primary Cardiologist: Thurmon Fair, MD   Preoperative team, please contact this patient and set up a phone call appointment for further preoperative risk assessment. Please obtain consent and complete medication review. Thank you for your help.  I confirm that guidance regarding antiplatelet and oral anticoagulation therapy has been completed and, if necessary, noted below.  None  I also confirmed the patient resides in the state of [place]. As per [facility] Medical Board telemedicine laws, the patient must reside in the state in which the provider is licensed.   Napoleon Form, Leodis Rains, NP 08/21/2023, 11:32 AM [facility]

## 2023-08-21 NOTE — Telephone Encounter (Signed)
   Pre-operative Risk Assessment    Patient Name: Paul Huber  DOB: Feb 18, 1935 MRN: [id number]  Last OV: Carlos Levering, NP 03/16/2023 Upcoming OV: None      Request for Surgical Clearance    Procedure:   Left Total Hip Arthroplasty  Date of Surgery:  Clearance TBD                                 Surgeon:  Dr. Samson Frederic Surgeon's Group or Practice Name:  [facility] Phone number:  [phone] Fax number:  [phone]   Type of Clearance Requested:   - Medical     Type of Anesthesia:  Spinal   Additional requests/questions:    Signed, Emmit Pomfret   08/21/2023, 11:22 AM

## 2023-08-23 NOTE — Telephone Encounter (Signed)
Left message to call back to set up tele pre op appt.  

## 2023-08-24 NOTE — Telephone Encounter (Signed)
1st attempt to reach pt to schedule tele visit. Lvm

## 2023-09-10 NOTE — Telephone Encounter (Signed)
Spoke with patient wife aware of appointment needed. Wife took number [phone] and stated husband will contact office back as soon as possible.

## 2023-09-12 NOTE — Telephone Encounter (Signed)
Our office has attempted x 3 to reach the pt to schedule a tele pre op appt. I will update the requesting office pt needs to call our back and schedule a tele pre op appt. Will remove from preop call back pool until the pt calls back.

## 2023-09-13 ENCOUNTER — Telehealth: Payer: Self-pay | Admitting: Cardiovascular Disease

## 2023-09-13 ENCOUNTER — Telehealth: Payer: Self-pay | Admitting: *Deleted

## 2023-09-13 NOTE — Telephone Encounter (Signed)
Pt has been scheduled tele pre op appt 10/18/23 per pt  request. 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

## 2023-09-13 NOTE — Telephone Encounter (Signed)
 See previous clearance encounter. Patient is returning call.

## 2023-09-13 NOTE — Telephone Encounter (Signed)
Pt has been scheduled tele pre op appt 10/18/23 per pt  request. 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     Patient Consent for Virtual Visit        Paul Huber has provided verbal consent on 09/13/2023 for a virtual visit (video or telephone).   CONSENT FOR VIRTUAL VISIT FOR:  Paul Huber  By participating in this virtual visit I agree to the following:  I hereby voluntarily request, consent and authorize [facility] and its employed or contracted physicians, physician assistants, nurse practitioners or other licensed health care professionals (the Practitioner), to provide me with telemedicine health care services (the "Services") as deemed necessary by the treating Practitioner. I acknowledge and consent to receive the Services by the Practitioner via telemedicine. I understand that the telemedicine visit will involve communicating with the Practitioner through live audiovisual communication technology and the disclosure of certain medical information by electronic transmission. I acknowledge that I have been given the opportunity to request an in-person assessment or other available alternative prior to the telemedicine visit and am voluntarily participating in the telemedicine visit.  I understand that I have the right to withhold or withdraw my consent to the use of telemedicine in the course of my care at any time, without affecting my right to future care or treatment, and that the Practitioner or I may terminate the telemedicine visit at any time. I understand that I have the right to inspect all information obtained and/or recorded in the course of the telemedicine visit and may receive copies of available information for a reasonable fee.  I understand that some of the potential risks of receiving the Services via telemedicine include:  Delay or interruption in medical evaluation due to technological equipment failure or disruption; Information transmitted may not  be sufficient (e.g. poor resolution of images) to allow for appropriate medical decision making by the Practitioner; and/or  In rare instances, security protocols could fail, causing a breach of personal health information.  Furthermore, I acknowledge that it is my responsibility to provide information about my medical history, conditions and care that is complete and accurate to the best of my ability. I acknowledge that Practitioner's advice, recommendations, and/or decision may be based on factors not within their control, such as incomplete or inaccurate data provided by me or distortions of diagnostic images or specimens that may result from electronic transmissions. I understand that the practice of medicine is not an exact science and that Practitioner makes no warranties or guarantees regarding treatment outcomes. I acknowledge that a copy of this consent can be made available to me via my patient portal [facility] MyChart), or I can request a printed copy by calling the office of [facility].    I understand that my insurance will be billed for this visit.   I have read or had this consent read to me. I understand the contents of this consent, which adequately explains the benefits and risks of the Services being provided via telemedicine.  I have been provided ample opportunity to ask questions regarding this consent and the Services and have had my questions answered to my satisfaction. I give my informed consent for the services to be provided through the use of telemedicine in my medical care

## 2023-10-18 ENCOUNTER — Ambulatory Visit: Payer: Medicare Other | Attending: Internal Medicine | Admitting: Nurse Practitioner

## 2023-10-18 DIAGNOSIS — Z01810 Encounter for preprocedural cardiovascular examination: Secondary | ICD-10-CM

## 2023-10-18 NOTE — Progress Notes (Signed)
Virtual Visit via Telephone Note   Because of GRAISEN BERGEMAN co-morbid illnesses, he is at least at moderate risk for complications without adequate follow up.  This format is felt to be most appropriate for this patient at this time.  The patient did not have access to video technology/had technical difficulties with video requiring transitioning to audio format only (telephone).  All issues noted in this document were discussed and addressed.  No physical exam could be performed with this format.  Please refer to the patient's chart for his consent to telehealth for [facility].  Evaluation Performed:  Preoperative cardiovascular risk assessment _____________   Date:  10/18/2023   Patient ID:  Paul Huber, DOB [birth year], MRN [id number] Patient Location:  Home Provider location:   Office  Primary Care Provider:  Lindwood Qua, MD Primary Cardiologist:  Thurmon Fair, MD  Chief Complaint / Patient Profile   89 y.o. y/o male with a h/o NICM, LBBB, PVCs, and hyperlipidemia, who is pending left total hip arthroplasty with Dr. Samson Frederic of [facility] and presents today for telephonic preoperative cardiovascular risk assessment.  History of Present Illness    Paul Huber is a 89 y.o. male who presents via audio/video conferencing for a telehealth visit today.  Pt was last seen in cardiology clinic on 03/16/2023 by Carlos Levering, NP. At that time Paul Huber was doing well.  The patient is now pending procedure as outlined above. Since his last visit, he has done well from a cardiac standpoint.   He denies chest pain, palpitations, dyspnea, pnd, orthopnea, n, v, dizziness, syncope, edema, weight gain, or early satiety. All other systems reviewed and are otherwise negative except as noted above.   Past Medical History    Past Medical History:  Diagnosis Date   Accelerated junctional rhythm    CAD (coronary artery disease) 05/17/2007   STRESS TEST- EKG  negative for ischemia   Dyslipidemia    PVC (premature ventricular contraction) 12/07/2010   2D ECHO- EF >55%   Past Surgical History:  Procedure Laterality Date   CARDIAC CATHETERIZATION Left 07/25/2002   No significant CAD    Allergies  No Known Allergies  Home Medications    Prior to Admission medications   Medication Sig Start Date End Date Taking? Authorizing Provider  atorvastatin (LIPITOR) 20 MG tablet TAKE ONE TABLET BY MOUTH EVERY DAY 09/02/21   Croitoru, Mihai, MD  celecoxib (CELEBREX) 200 MG capsule Take 1 capsule by mouth daily. 12/06/22   [provider]  diltiazem (TIAZAC) 120 MG 24 hr capsule TAKE ONE CAPSULE BY MOUTH EVERY DAY 11/08/22   Croitoru, Mihai, MD  levothyroxine (SYNTHROID, LEVOTHROID) 137 MCG tablet Take 1 tablet by mouth daily. 01/04/18 09/13/23  [provider]  Multiple Vitamin (MULTI-VITAMIN) tablet Take 1 tablet by mouth daily.    [provider]  sertraline (ZOLOFT) 100 MG tablet Take 100 mg by mouth daily. 01/03/18   [provider]    Physical Exam    Vital Signs:  Paul Huber does not have vital signs available for review today.  Given telephonic nature of communication, physical exam is limited. AAOx3. NAD. Normal affect.  Speech and respirations are unlabored.  Accessory Clinical Findings    None  Assessment & Plan    1.  Preoperative Cardiovascular Risk Assessment:  According to the Revised Cardiac Risk Index (RCRI), his Perioperative Risk of Major Cardiac Event is (%): 0.4. His Functional Capacity in METs is: 7.01 according  to the [facility] Activity Status Index (DASI). Therefore, based on ACC/AHA guidelines, patient would be at acceptable risk for the planned procedure without further cardiovascular testing.   The patient was advised that if he develops new symptoms prior to surgery to contact our office to arrange for a follow-up visit, and he verbalized understanding.  A copy of this note will be routed  to requesting surgeon.  Time:   Today, I have spent 7 minutes with the patient with telehealth technology discussing medical history, symptoms, and management plan.     Joylene Grapes, NP  10/18/2023, 10:10 AM

## 2023-11-08 ENCOUNTER — Encounter: Payer: Self-pay | Admitting: Cardiovascular Disease

## 2023-11-17 ENCOUNTER — Other Ambulatory Visit: Payer: Self-pay | Admitting: Cardiovascular Disease

## 2023-11-23 NOTE — Progress Notes (Signed)
 Sent message, via epic in basket, requesting orders in epic from Careers adviser.

## 2023-11-26 ENCOUNTER — Ambulatory Visit: Payer: Self-pay | Admitting: Student

## 2023-11-29 NOTE — Progress Notes (Signed)
 COVID Vaccine received:  []  No [x]  Yes Date of any COVID positive Test in last 90 days: no PCP - Dr. Lindwood Qua Cardiologist -Rachelle Hora Croitoru MD  Chest x-ray -  EKG -  03/16/23 Epic Stress Test - 02/14/18 Epic ECHO - 09/02/20 Epic Cardiac Cath - 07/25/02 Epic  Cardiac clearance- 10/18/23-Emily Monge NP  Bowel Prep - [x]  No  []   Yes ______  Pacemaker / ICD device [x]  No []  Yes   Spinal Cord Stimulator:[x]  No []  Yes       History of Sleep Apnea? [x]  No []  Yes   CPAP used?- [x]  No []  Yes    Does the patient monitor blood sugar?          [x]  No []  Yes  []  N/A  Patient has: [x]  NO Hx DM   []  Pre-DM                 []  DM1  []   DM2 Does patient have a [employer] or [employer]om? []  No []  Yes   Fasting Blood Sugar Ranges-  Checks Blood Sugar _____ times a day  GLP1 agonist / usual dose - no GLP1 instructions:  SGLT-2 inhibitors / usual dose - no SGLT-2 instructions:   Blood Thinner / Instructions:no Aspirin Instructions:no  Comments:   Activity level: Patient is able  to climb a flight of stairs without difficulty; [x]  No CP  [x]  No SOB, _   Patient can perform ADLs without assistance.   Anesthesia review: Cardiomyopathy, CAD, LBBB,   Patient denies shortness of breath, fever, cough and chest pain at PAT appointment.  Patient verbalized understanding and agreement to the Pre-Surgical Instructions that were given to them at this PAT appointment. Patient was also educated of the need to review these PAT instructions again prior to his/her surgery.I reviewed the appropriate phone numbers to call if they have any and questions or concerns.

## 2023-11-29 NOTE — Patient Instructions (Signed)
 SURGICAL WAITING ROOM VISITATION  Patients having surgery or a procedure may have no more than 2 support people in the waiting area - these visitors may rotate.    Children under the age of 47 must have an adult with them who is not the patient.  Due to an increase in RSV and influenza rates and associated hospitalizations, children ages 33 and under may not visit patients in [facility] hospitals.  Visitors with respiratory illnesses are discouraged from visiting and should remain at home.  If the patient needs to stay at the hospital during part of their recovery, the visitor guidelines for inpatient rooms apply. Pre-op nurse will coordinate an appropriate time for 1 support person to accompany patient in pre-op.  This support person may not rotate.    Please refer to the [facility] website for the visitor guidelines for Inpatients (after your surgery is over and you are in a regular room).       Your procedure is scheduled on: 12/06/23   Report to [facility] Main Entrance    Report to admitting at 5:15 AM   Call this number if you have problems the morning of surgery [phone]   Do not eat food :After Midnight.   After Midnight you may have the following liquids until 4:30 AM DAY OF SURGERY  Water Non-Citrus Juices (without pulp, NO RED-Apple, White grape, White cranberry) Black Coffee (NO MILK/CREAM OR CREAMERS, sugar ok)  Clear Tea (NO MILK/CREAM OR CREAMERS, sugar ok) regular and decaf                             Plain Jell-O (NO RED)                                           Fruit ices (not with fruit pulp, NO RED)                                     Popsicles (NO RED)                                                               Sports drinks like Gatorade (NO RED)                  The day of surgery:  Drink ONE (1) Pre-Surgery Clear Ensure 4:30 AM the morning of surgery. Drink in one sitting. Do not sip.  This drink was given to you during your hospital   pre-op appointment visit. Nothing else to drink after completing the  Pre-Surgery Clear Ensure.     Oral Hygiene is also important to reduce your risk of infection.                                    Remember - BRUSH YOUR TEETH THE MORNING OF SURGERY WITH YOUR REGULAR TOOTHPASTE  DENTURES WILL BE REMOVED PRIOR TO SURGERY PLEASE DO NOT APPLY "Poly grip" OR ADHESIVES!!!   Stop all vitamins  and herbal supplements 7 days before surgery.   Take these medicines the morning of surgery with A SIP OF WATER: Atorvastatin, Diltiazem(cardiazem), Levothyroxine, Sertraline(Zoloft)             You may not have any metal on your body including hair pins, jewelry, and body piercing             Do not wear make-up, lotions, powders, perfumes/cologne, or deodorant              Men may shave face and neck.   Do not bring valuables to the hospital. [facility] IS NOT             RESPONSIBLE   FOR VALUABLES.   Contacts, glasses, dentures or bridgework may not be worn into surgery.   Bring small overnight bag day of surgery.   DO NOT BRING YOUR HOME MEDICATIONS TO THE HOSPITAL. PHARMACY WILL DISPENSE MEDICATIONS LISTED ON YOUR MEDICATION LIST TO YOU DURING YOUR ADMISSION IN THE HOSPITAL!    Patients discharged on the day of surgery will not be allowed to drive home.  Someone NEEDS to stay with you for the first 24 hours after anesthesia.   Special Instructions: Bring a copy of your healthcare power of attorney and living will documents the day of surgery if you haven't scanned them before.              Please read over the following fact sheets you were given: IF YOU HAVE QUESTIONS ABOUT YOUR PRE-OP INSTRUCTIONS PLEASE CALL [phone] Paul Huber   If you received a COVID test during your pre-op visit  it is requested that you wear a mask when out in public, stay away from anyone that may not be feeling well and notify your surgeon if you develop symptoms. If you test positive for Covid or have been in  contact with anyone that has tested positive in the last 10 days please notify you surgeon.      Pre-operative 5 CHG Huber Instructions   You can play a key role in reducing the risk of infection after surgery. Your skin needs to be as free of germs as possible. You can reduce the number of germs on your skin by washing with CHG (chlorhexidine gluconate) soap before surgery. CHG is an antiseptic soap that kills germs and continues to kill germs even after washing.   DO NOT use if you have an allergy to chlorhexidine/CHG or antibacterial soaps. If your skin becomes reddened or irritated, stop using the CHG and notify one of our RNs at [phone].   Please shower with the CHG soap starting 4 days before surgery using the following schedule:     Please keep in mind the following:  DO NOT shave, including legs and underarms, starting the day of your first shower.   You may shave your face at any point before/day of surgery.  Place clean sheets on your bed the day you start using CHG soap. Use a clean washcloth (not used since being washed) for each shower. DO NOT sleep with pets once you start using the CHG.   CHG Shower Instructions:  If you choose to wash your hair and private area, wash first with your normal shampoo/soap.  After you use shampoo/soap, rinse your hair and body thoroughly to remove shampoo/soap residue.  Turn the water OFF and apply about 3 tablespoons (45 ml) of CHG soap to a CLEAN washcloth.  Apply CHG soap ONLY FROM YOUR NECK DOWN TO  YOUR TOES (washing for 3-5 minutes)  DO NOT use CHG soap on face, private areas, open wounds, or sores.  Pay special attention to the area where your surgery is being performed.  If you are having back surgery, having someone wash your back for you may be helpful. Wait 2 minutes after CHG soap is applied, then you may rinse off the CHG soap.  Pat dry with a clean towel  Put on clean clothes/pajamas   If you choose to wear lotion, please  use ONLY the CHG-compatible lotions on the back of this paper.     Additional instructions for the day of surgery: DO NOT APPLY any lotions, deodorants, cologne, or perfumes.   Put on clean/comfortable clothes.  Brush your teeth.  Ask your nurse before applying any prescription medications to the skin.      CHG Compatible Lotions   Aveeno Moisturizing lotion  Cetaphil Moisturizing Cream  Cetaphil Moisturizing Lotion  Clairol Herbal Essence Moisturizing Lotion, Dry Skin  Clairol Herbal Essence Moisturizing Lotion, Extra Dry Skin  Clairol Herbal Essence Moisturizing Lotion, Normal Skin  Curel Age Defying Therapeutic Moisturizing Lotion with Alpha Hydroxy  Curel Extreme Care Body Lotion  Curel Soothing Hands Moisturizing Hand Lotion  Curel Therapeutic Moisturizing Cream, Fragrance-Free  Curel Therapeutic Moisturizing Lotion, Fragrance-Free  Curel Therapeutic Moisturizing Lotion, Original Formula  Eucerin Daily Replenishing Lotion  Eucerin Dry Skin Therapy Plus Alpha Hydroxy Crme  Eucerin Dry Skin Therapy Plus Alpha Hydroxy Lotion  Eucerin Original Crme  Eucerin Original Lotion  Eucerin Plus Crme Eucerin Plus Lotion  Eucerin TriLipid Replenishing Lotion  Keri Anti-Bacterial Hand Lotion  Keri Deep Conditioning Original Lotion Dry Skin Formula Softly Scented  Keri Deep Conditioning Original Lotion, Fragrance Free Sensitive Skin Formula  Keri Lotion Fast Absorbing Fragrance Free Sensitive Skin Formula  Keri Lotion Fast Absorbing Softly Scented Dry Skin Formula  Keri Original Lotion  Keri Skin Renewal Lotion Keri Silky Smooth Lotion  Keri Silky Smooth Sensitive Skin Lotion  Nivea Body Creamy Conditioning Oil  Nivea Body Extra Enriched Lotion  Nivea Body Original Lotion  Nivea Body Sheer Moisturizing Lotion Nivea Crme  Nivea Skin Firming Lotion  NutraDerm 30 Skin Lotion  NutraDerm Skin Lotion  NutraDerm Therapeutic Skin Cream  NutraDerm Therapeutic Skin Lotion  ProShield  Protective Hand Cream   Incentive Spirometer (Watch this video at home: [email or web address])  An incentive spirometer is a tool that can help keep your lungs clear and active. This tool measures how well you are filling your lungs with each breath. Taking long deep breaths may help reverse or decrease the chance of developing breathing (pulmonary) problems (especially infection) following: A long period of time when you are unable to move or be active. BEFORE THE PROCEDURE  If the spirometer includes an indicator to show your best effort, your nurse or respiratory therapist will set it to a desired goal. If possible, sit up straight or lean slightly forward. Try not to slouch. Hold the incentive spirometer in an upright position. INSTRUCTIONS FOR USE  Sit on the edge of your bed if possible, or sit up as far as you can in bed or on a chair. Hold the incentive spirometer in an upright position. Breathe out normally. Place the mouthpiece in your mouth and seal your lips tightly around it. Breathe in slowly and as deeply as possible, raising the piston or the ball toward the top of the column. Hold your breath for 3-5 seconds or for as long as possible. Allow the piston  or ball to fall to the bottom of the column. Remove the mouthpiece from your mouth and breathe out normally. Rest for a few seconds and repeat Steps 1 through 7 at least 10 times every 1-2 hours when you are awake. Take your time and take a few normal breaths between deep breaths. The spirometer may include an indicator to show your best effort. Use the indicator as a goal to work toward during each repetition. After each set of 10 deep breaths, practice coughing to be sure your lungs are clear. If you have an incision (the cut made at the time of surgery), support your incision when coughing by placing a pillow or rolled up towels firmly against it. Once you are able to get out of bed, walk around indoors  and cough well. You may stop using the incentive spirometer when instructed by your caregiver.  RISKS AND COMPLICATIONS Take your time so you do not get dizzy or light-headed. If you are in pain, you may need to take or ask for pain medication before doing incentive spirometry. It is harder to take a deep breath if you are having pain. AFTER USE Rest and breathe slowly and easily. It can be helpful to keep track of a log of your progress. Your caregiver can provide you with a simple table to help with this. If you are using the spirometer at home, follow these instructions: SEEK MEDICAL CARE IF:  You are having difficultly using the spirometer. You have trouble using the spirometer as often as instructed. Your pain medication is not giving enough relief while using the spirometer. You develop fever of 100.5 F (38.1 C) or higher. SEEK IMMEDIATE MEDICAL CARE IF:  You cough up bloody sputum that had not been present before. You develop fever of 102 F (38.9 C) or greater. You develop worsening pain at or near the incision site. MAKE SURE YOU:  Understand these instructions. Will watch your condition. Will get help right away if you are not doing well or get worse. Document Released: 01/29/2007 Document Revised: 12/11/2011 Document Reviewed: 04/01/2007 [facility] Patient Information 2014 [place], [place].

## 2023-11-30 ENCOUNTER — Ambulatory Visit: Payer: Self-pay | Admitting: Student

## 2023-11-30 ENCOUNTER — Other Ambulatory Visit: Payer: Self-pay

## 2023-11-30 ENCOUNTER — Encounter (HOSPITAL_COMMUNITY): Payer: Self-pay

## 2023-11-30 ENCOUNTER — Encounter (HOSPITAL_COMMUNITY)
Admission: RE | Admit: 2023-11-30 | Discharge: 2023-11-30 | Disposition: A | Discharge status: Home / Self Care | Payer: Medicare Other | Source: Ambulatory Visit | Attending: Orthopedic Surgery | Admitting: Orthopedic Surgery

## 2023-11-30 VITALS — BP 147/88 | HR 90 | Temp 98.2°F | Resp 18 | Ht 71.0 in | Wt 221.0 lb

## 2023-11-30 DIAGNOSIS — Z01818 Encounter for other preprocedural examination: Secondary | ICD-10-CM | POA: Diagnosis present

## 2023-11-30 DIAGNOSIS — I493 Ventricular premature depolarization: Secondary | ICD-10-CM | POA: Diagnosis not present

## 2023-11-30 DIAGNOSIS — Z79899 Other long term (current) drug therapy: Secondary | ICD-10-CM | POA: Diagnosis not present

## 2023-11-30 DIAGNOSIS — I428 Other cardiomyopathies: Secondary | ICD-10-CM | POA: Diagnosis not present

## 2023-11-30 DIAGNOSIS — E039 Hypothyroidism, unspecified: Secondary | ICD-10-CM | POA: Insufficient documentation

## 2023-11-30 DIAGNOSIS — Z01812 Encounter for preprocedural laboratory examination: Secondary | ICD-10-CM | POA: Diagnosis not present

## 2023-11-30 DIAGNOSIS — I429 Cardiomyopathy, unspecified: Secondary | ICD-10-CM

## 2023-11-30 DIAGNOSIS — M1612 Unilateral primary osteoarthritis, left hip: Secondary | ICD-10-CM | POA: Diagnosis not present

## 2023-11-30 HISTORY — DX: Hypothyroidism, unspecified: E03.9

## 2023-11-30 HISTORY — DX: Other cardiomyopathies: I42.8

## 2023-11-30 HISTORY — DX: Cardiac arrhythmia, unspecified: I49.9

## 2023-11-30 HISTORY — DX: Malignant (primary) neoplasm, unspecified: C80.1

## 2023-11-30 HISTORY — DX: Unspecified osteoarthritis, unspecified site: M19.90

## 2023-11-30 LAB — BASIC METABOLIC PANEL
Anion gap: 7 (ref 5–15)
BUN: 17 mg/dL (ref 8–23)
CO2: 26 mmol/L (ref 22–32)
Calcium: 9.1 mg/dL (ref 8.9–10.3)
Chloride: 108 mmol/L (ref 98–111)
Creatinine, Ser: 1.1 mg/dL (ref 0.61–1.24)
GFR, Estimated: >60 mL/min (ref >60)
Glucose, Bld: 108 mg/dL — ABNORMAL HIGH (ref 70–99)
Potassium: 4 mmol/L (ref 3.5–5.1)
Sodium: 141 mmol/L (ref 135–145)

## 2023-11-30 LAB — CBC
HCT: 39.4 % (ref 39.0–52.0)
Hemoglobin: 12.7 g/dL — ABNORMAL LOW (ref 13.0–17.0)
MCH: 32.2 pg (ref 26.0–34.0)
MCHC: 32.2 g/dL (ref 30.0–36.0)
MCV: 99.7 fL (ref 80.0–100.0)
Platelets: 168 10*3/uL (ref 150–400)
RBC: 3.95 MIL/uL — ABNORMAL LOW (ref 4.22–5.81)
RDW: 13.2 % (ref 11.5–15.5)
WBC: 6.1 10*3/uL (ref 4.0–10.5)
nRBC: 0 % (ref 0.0–0.2)

## 2023-11-30 LAB — SURGICAL PCR SCREEN
MRSA, PCR: NEGATIVE
Staphylococcus aureus: NEGATIVE

## 2023-11-30 NOTE — H&P (View-Only) (Signed)
 TOTAL HIP ADMISSION H&P  Patient is admitted for left total hip arthroplasty.  Subjective:  Chief Complaint: left hip pain  HPI: Paul Huber, 89 y.o. male, has a history of pain and functional disability in the left hip(s) due to arthritis and patient has failed non-surgical conservative treatments for greater than 12 weeks to include NSAID's and/or analgesics, corticosteriod injections, flexibility and strengthening excercises, use of assistive devices, and activity modification.  Onset of symptoms was gradual starting 10 years ago with rapidlly worsening course since that time.The patient noted no past surgery on the left hip(s).  Patient currently rates pain in the left hip at 10 out of 10 with activity. Patient has night pain, worsening of pain with activity and weight bearing, trendelenberg gait, pain that interfers with activities of daily living, and pain with passive range of motion. Patient has evidence of subchondral cysts, subchondral sclerosis, periarticular osteophytes, and joint space narrowing by imaging studies. This condition presents safety issues increasing the risk of falls.  There is no current active infection.  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   Overweight (BMI 25.0-29.9) 04/18/2013   Mixed hyperlipidemia 04/18/2013   Past Medical History:  Diagnosis Date   Accelerated junctional rhythm    Arthritis    CAD (coronary artery disease) 05/17/2007   STRESS TEST- EKG negative for ischemia   Cancer (HCC)    Dyslipidemia    Dysrhythmia    Hypothyroidism    PVC (premature ventricular contraction) 12/07/2010   2D ECHO- EF >55%    Past Surgical History:  Procedure Laterality Date   CARDIAC CATHETERIZATION Left 07/25/2002   No significant CAD    Current Outpatient Medications  Medication Sig Dispense Refill Last  Dose/Taking   acetaminophen (TYLENOL) 650 MG CR tablet Take 650 mg by mouth every 8 (eight) hours as needed for pain.      atorvastatin (LIPITOR) 20 MG tablet TAKE ONE TABLET BY MOUTH EVERY DAY 54 tablet 0    celecoxib (CELEBREX) 200 MG capsule Take 200 mg by mouth daily.      diltiazem (TIAZAC) 120 MG 24 hr capsule TAKE ONE CAPSULE BY MOUTH EVERY DAY 30 capsule 0    levothyroxine (SYNTHROID, LEVOTHROID) 137 MCG tablet Take 1 tablet by mouth daily.      sertraline (ZOLOFT) 100 MG tablet Take 100 mg by mouth daily.  11    No current facility-administered medications for this visit.   No Known Allergies  Social History   Tobacco Use   Smoking status: Former    Types: Pipe   Smokeless tobacco: Former    Quit date: 10/02/1979  Substance Use Topics   Alcohol use: Yes    Comment: rare    Family History  Problem Relation Age of Onset   Dementia Mother    Heart disease Father 48       CABGx2   Cancer Maternal Grandmother    Heart attack Maternal Grandfather    Heart failure Paternal Grandfather    Heart disease Brother      Review of Systems  Musculoskeletal:  Positive for arthralgias and gait problem.  All other systems reviewed and are negative.   Objective:  Physical Exam Constitutional:      Appearance: Normal appearance.  HENT:     Head: Normocephalic and atraumatic.     Nose: Nose normal.     Mouth/Throat:  Mouth: Mucous membranes are moist.     Pharynx: Oropharynx is clear.  Eyes:     Conjunctiva/sclera: Conjunctivae normal.  Cardiovascular:     Rate and Rhythm: Normal rate and regular rhythm.     Pulses: Normal pulses.     Heart sounds: Normal heart sounds.  Pulmonary:     Effort: Pulmonary effort is normal.     Breath sounds: Normal breath sounds.  Abdominal:     General: Abdomen is flat.     Palpations: Abdomen is soft.  Genitourinary:    Comments: Deferred.  Musculoskeletal:     Cervical back: Normal range of motion and neck supple.     Comments:  Examination of the left hip reveals no skin wounds or lesions. Pain with flexion and rotation of the hip. Significant motion restriction noted. Trochanteric tenderness to palpation.   Pannicular fold moisture and erythema.   Neurovascularly intact distally. Palpable pedal pulses.   Skin:    General: Skin is warm and dry.     Capillary Refill: Capillary refill takes less than 2 seconds.  Neurological:     General: No focal deficit present.     Mental Status: He is alert and oriented to person, place, and time.  Psychiatric:        Mood and Affect: Mood normal.        Behavior: Behavior normal.        Thought Content: Thought content normal.        Judgment: Judgment normal.     Vital signs in last 24 hours: @VSRANGES @  Labs:   Estimated body mass index is 30.82 kg/m as calculated from the following:   Height as of an earlier encounter on 11/30/23: 5\' 11"  (1.803 m).   Weight as of an earlier encounter on 11/30/23: 100.2 kg.   Imaging Review Plain radiographs demonstrate severe degenerative joint disease of the left hip(s). The bone quality appears to be adequate for age and reported activity level.      Assessment/Plan:  End stage arthritis, left hip(s)  The patient history, physical examination, clinical judgement of the provider and imaging studies are consistent with end stage degenerative joint disease of the left hip(s) and total hip arthroplasty is deemed medically necessary. The treatment options including medical management, injection therapy, arthroscopy and arthroplasty were discussed at length. The risks and benefits of total hip arthroplasty were presented and reviewed. The risks due to aseptic loosening, infection, stiffness, dislocation/subluxation,  thromboembolic complications and other imponderables were discussed.  The patient acknowledged the explanation, agreed to proceed with the plan and consent was signed. Patient is being admitted for inpatient treatment  for surgery, pain control, PT, OT, prophylactic antibiotics, VTE prophylaxis, progressive ambulation and ADL's and discharge planning.The patient is planning to be discharged home with HEP after an overnight stay.   Therapy Plans: HEP.  Disposition: Home with wife and son Planned DVT Prophylaxis: aspirin 81mg  BID DME needed: Has rolling walker.  PCP: Cleared. Cardiology: Cleared. If having increased flurries of SVT's can increase diltiazem dose (currently on lowest dose).  TXA: IV Allergies: NDKA. Anesthesia Concerns: None.  BMI: 31.4 Last HgbA1c: 5.9 Other: - CAD, PVCs - Moisture and erythema in pannicular fold and groin, nystatin powder sent to pharmacy.  - Hydrocodone, zofran, has celebrex.  - 10/16/23: Hgb 13.5, K+ 5.9, Cr. 0.90.  - Wants [facility] pharmacy for prescriptions.  - 11/30/23: Hgb 13.7, K+ 4.0, Cr. 1.10.     Patient's anticipated LOS is less than 2 midnights,  meeting these requirements: - Younger than 80 - Lives within 1 hour of care - Has a competent adult at home to recover with post-op recover - NO history of  - Chronic pain requiring opiods  - Diabetes  - Coronary Artery Disease  - Heart failure  - Heart attack  - Stroke  - DVT/VTE  - Cardiac arrhythmia  - Respiratory Failure/COPD  - Renal failure  - Anemia  - Advanced Liver disease

## 2023-11-30 NOTE — H&P (Signed)
 TOTAL HIP ADMISSION H&P  Patient is admitted for left total hip arthroplasty.  Subjective:  Chief Complaint: left hip pain  HPI: Paul Huber, 89 y.o. male, has a history of pain and functional disability in the left hip(s) due to arthritis and patient has failed non-surgical conservative treatments for greater than 12 weeks to include NSAID's and/or analgesics, corticosteriod injections, flexibility and strengthening excercises, use of assistive devices, and activity modification.  Onset of symptoms was gradual starting 10 years ago with rapidlly worsening course since that time.The patient noted no past surgery on the left hip(s).  Patient currently rates pain in the left hip at 10 out of 10 with activity. Patient has night pain, worsening of pain with activity and weight bearing, trendelenberg gait, pain that interfers with activities of daily living, and pain with passive range of motion. Patient has evidence of subchondral cysts, subchondral sclerosis, periarticular osteophytes, and joint space narrowing by imaging studies. This condition presents safety issues increasing the risk of falls.  There is no current active infection.  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   Overweight (BMI 25.0-29.9) 04/18/2013   Mixed hyperlipidemia 04/18/2013   Past Medical History:  Diagnosis Date   Accelerated junctional rhythm    Arthritis    CAD (coronary artery disease) 05/17/2007   STRESS TEST- EKG negative for ischemia   Cancer (HCC)    Dyslipidemia    Dysrhythmia    Hypothyroidism    PVC (premature ventricular contraction) 12/07/2010   2D ECHO- EF >55%    Past Surgical History:  Procedure Laterality Date   CARDIAC CATHETERIZATION Left 07/25/2002   No significant CAD    Current Outpatient Medications  Medication Sig Dispense Refill Last  Dose/Taking   acetaminophen (TYLENOL) 650 MG CR tablet Take 650 mg by mouth every 8 (eight) hours as needed for pain.      atorvastatin (LIPITOR) 20 MG tablet TAKE ONE TABLET BY MOUTH EVERY DAY 54 tablet 0    celecoxib (CELEBREX) 200 MG capsule Take 200 mg by mouth daily.      diltiazem (TIAZAC) 120 MG 24 hr capsule TAKE ONE CAPSULE BY MOUTH EVERY DAY 30 capsule 0    levothyroxine (SYNTHROID, LEVOTHROID) 137 MCG tablet Take 1 tablet by mouth daily.      sertraline (ZOLOFT) 100 MG tablet Take 100 mg by mouth daily.  11    No current facility-administered medications for this visit.   No Known Allergies  Social History   Tobacco Use   Smoking status: Former    Types: Pipe   Smokeless tobacco: Former    Quit date: 10/02/1979  Substance Use Topics   Alcohol use: Yes    Comment: rare    Family History  Problem Relation Age of Onset   Dementia Mother    Heart disease Father 48       CABGx2   Cancer Maternal Grandmother    Heart attack Maternal Grandfather    Heart failure Paternal Grandfather    Heart disease Brother      Review of Systems  Musculoskeletal:  Positive for arthralgias and gait problem.  All other systems reviewed and are negative.   Objective:  Physical Exam Constitutional:      Appearance: Normal appearance.  HENT:     Head: Normocephalic and atraumatic.     Nose: Nose normal.     Mouth/Throat:  Mouth: Mucous membranes are moist.     Pharynx: Oropharynx is clear.  Eyes:     Conjunctiva/sclera: Conjunctivae normal.  Cardiovascular:     Rate and Rhythm: Normal rate and regular rhythm.     Pulses: Normal pulses.     Heart sounds: Normal heart sounds.  Pulmonary:     Effort: Pulmonary effort is normal.     Breath sounds: Normal breath sounds.  Abdominal:     General: Abdomen is flat.     Palpations: Abdomen is soft.  Genitourinary:    Comments: Deferred.  Musculoskeletal:     Cervical back: Normal range of motion and neck supple.     Comments:  Examination of the left hip reveals no skin wounds or lesions. Pain with flexion and rotation of the hip. Significant motion restriction noted. Trochanteric tenderness to palpation.   Pannicular fold moisture and erythema.   Neurovascularly intact distally. Palpable pedal pulses.   Skin:    General: Skin is warm and dry.     Capillary Refill: Capillary refill takes less than 2 seconds.  Neurological:     General: No focal deficit present.     Mental Status: He is alert and oriented to person, place, and time.  Psychiatric:        Mood and Affect: Mood normal.        Behavior: Behavior normal.        Thought Content: Thought content normal.        Judgment: Judgment normal.     Vital signs in last 24 hours: @VSRANGES @  Labs:   Estimated body mass index is 30.82 kg/m as calculated from the following:   Height as of an earlier encounter on 11/30/23: 5\' 11"  (1.803 m).   Weight as of an earlier encounter on 11/30/23: 100.2 kg.   Imaging Review Plain radiographs demonstrate severe degenerative joint disease of the left hip(s). The bone quality appears to be adequate for age and reported activity level.      Assessment/Plan:  End stage arthritis, left hip(s)  The patient history, physical examination, clinical judgement of the provider and imaging studies are consistent with end stage degenerative joint disease of the left hip(s) and total hip arthroplasty is deemed medically necessary. The treatment options including medical management, injection therapy, arthroscopy and arthroplasty were discussed at length. The risks and benefits of total hip arthroplasty were presented and reviewed. The risks due to aseptic loosening, infection, stiffness, dislocation/subluxation,  thromboembolic complications and other imponderables were discussed.  The patient acknowledged the explanation, agreed to proceed with the plan and consent was signed. Patient is being admitted for inpatient treatment  for surgery, pain control, PT, OT, prophylactic antibiotics, VTE prophylaxis, progressive ambulation and ADL's and discharge planning.The patient is planning to be discharged home with HEP after an overnight stay.   Therapy Plans: HEP.  Disposition: Home with wife and son Planned DVT Prophylaxis: aspirin 81mg  BID DME needed: Has rolling walker.  PCP: Cleared. Cardiology: Cleared. If having increased flurries of SVT's can increase diltiazem dose (currently on lowest dose).  TXA: IV Allergies: NDKA. Anesthesia Concerns: None.  BMI: 31.4 Last HgbA1c: 5.9 Other: - CAD, PVCs - Moisture and erythema in pannicular fold and groin, nystatin powder sent to pharmacy.  - Hydrocodone, zofran, has celebrex.  - 10/16/23: Hgb 13.5, K+ 5.9, Cr. 0.90.  - Wants [facility] pharmacy for prescriptions.  - 11/30/23: Hgb 13.7, K+ 4.0, Cr. 1.10.     Patient's anticipated LOS is less than 2 midnights,  meeting these requirements: - Younger than 80 - Lives within 1 hour of care - Has a competent adult at home to recover with post-op recover - NO history of  - Chronic pain requiring opiods  - Diabetes  - Coronary Artery Disease  - Heart failure  - Heart attack  - Stroke  - DVT/VTE  - Cardiac arrhythmia  - Respiratory Failure/COPD  - Renal failure  - Anemia  - Advanced Liver disease

## 2023-12-03 ENCOUNTER — Encounter (HOSPITAL_COMMUNITY): Payer: Self-pay

## 2023-12-03 NOTE — Anesthesia Preprocedure Evaluation (Signed)
 Anesthesia Evaluation  Patient identified by MRN, date of birth, ID band Patient awake    Reviewed: Allergy & Precautions, NPO status , Patient's Chart, lab work & pertinent test results  History of Anesthesia Complications Negative for: history of anesthetic complications  Airway Mallampati: I  TM Distance: >3 FB Neck ROM: Full    Dental  (+) Edentulous Upper, Missing, Dental Advisory Given, Chipped   Pulmonary former smoker   breath sounds clear to auscultation       Cardiovascular hypertension, Pt. on medications + dysrhythmias (PVCs)  Rhythm:Regular Rate:Normal  '21 ECHO: EF 60-65%, normal LVF, Grade 1 DD, no significant valvular abnormalities   Neuro/Psych negative neurological ROS     GI/Hepatic negative GI ROS, Neg liver ROS,,,  Endo/Other  Hypothyroidism    Renal/GU negative Renal ROS     Musculoskeletal  (+) Arthritis , Osteoarthritis,    Abdominal   Peds  Hematology Hb 12.7, plt 168k   Anesthesia Other Findings   Reproductive/Obstetrics                             Anesthesia Physical Anesthesia Plan  ASA: 3  Anesthesia Plan: Spinal   Post-op Pain Management: Tylenol PO (pre-op)*   Induction:   PONV Risk Score and Plan: 1 and Ondansetron  Airway Management Planned: Natural Airway and Simple Face Mask  Additional Equipment: None  Intra-op Plan:   Post-operative Plan:   Informed Consent: I have reviewed the patients History and Physical, chart, labs and discussed the procedure including the risks, benefits and alternatives for the proposed anesthesia with the patient or authorized representative who has indicated his/her understanding and acceptance.     Dental advisory given  Plan Discussed with: CRNA and Surgeon  Anesthesia Plan Comments: (See PAT note from 2/28 by Sherlie Ban PA-C )        Anesthesia Quick Evaluation

## 2023-12-03 NOTE — Progress Notes (Signed)
 Case: [id number] Date/Time: 12/06/23 0715   Procedure: TOTAL HIP ARTHROPLASTY ANTERIOR APPROACH (Left: Hip)   Anesthesia type: Spinal   Pre-op diagnosis: Left hip osteoarthritis   Location: [facility] ROOM 08 / [facility]   Surgeons: Samson Frederic, MD       DISCUSSION: Paul Huber is an 89 yo male who presents to PAT prior to surgery above. PMH of NICM, PVCs, accelerated junctional rhythm, hypothyroidism.  Last seen in clinic for above hx on 03/16/23 for routine f/u. He has hx of NICM with recovered EF. Angiography in 2003 did not show CAD. He also has hx of palpitations and PVCs which is controlled with Diltiazem. He has known incomplete LBBB and hx of accelerated junctional rhythm. EKG in June 2024 showed NSR. Cardiac clearance eval done on 10/18/23 and he was cleared for surgery:  "Preoperative Cardiovascular Risk Assessment:   According to the Revised Cardiac Risk Index (RCRI), his Perioperative Risk of Major Cardiac Event is (%): 0.4. His Functional Capacity in METs is: 7.01 according to the Duke Activity Status Index (DASI). Therefore, based on ACC/AHA guidelines, patient would be at acceptable risk for the planned procedure without further cardiovascular testing. "    VS: BP (!) 147/88   Pulse 90   Temp 36.8 C (Oral)   Resp 18   Ht 5\' 11"  (1.803 m)   Wt 100.2 kg   SpO2 97%   BMI 30.82 kg/m   PROVIDERS: Lindwood Qua, MD Primary Cardiologist:  Thurmon Fair, MD  LABS: Labs reviewed: Acceptable for surgery. (all labs ordered are listed, but only abnormal results are displayed)  Labs Reviewed  BASIC METABOLIC PANEL - Abnormal; Notable for the following components:      Result Value   Glucose, Bld 108 (*)    All other components within normal limits  CBC - Abnormal; Notable for the following components:   RBC 3.95 (*)    Hemoglobin 12.7 (*)    All other components within normal limits  SURGICAL PCR SCREEN  TYPE AND SCREEN     IMAGES:   EKG 03/16/23  NSR, rate  76 Incomplete LBBB  CV:  Echo 09/02/2020:  IMPRESSIONS     1. Left ventricular ejection fraction, by estimation, is 60 to 65%. Left  ventricular ejection fraction by 3D volume is 61 %. The left ventricle has  normal function. The left ventricle has no regional wall motion  abnormalities. Left ventricular diastolic   parameters are consistent with Grade I diastolic dysfunction (impaired  relaxation).   2. Right ventricular systolic function is normal. The right ventricular  size is normal. There is normal pulmonary artery systolic pressure. The  estimated right ventricular systolic pressure is 23.4 mmHg.   3. The mitral valve is grossly normal. Trivial mitral valve  regurgitation. No evidence of mitral stenosis.   4. The aortic valve is tricuspid. Aortic valve regurgitation is not  visualized. No aortic stenosis is present.   5. The inferior vena cava is normal in size with greater than 50%  respiratory variability, suggesting right atrial pressure of 3 mmHg.   Stress test 02/14/2018:   Blood pressure demonstrated a normal response to exercise. There was no ST segment deviation noted during stress. No T wave inversion was noted during stress.   Borderline exercise tolerance and rapid heart rate increase suggestive of deconditioning. Otherwise normal ECG stress test     Past Medical History:  Diagnosis Date   Accelerated junctional rhythm    Arthritis    CAD (coronary  artery disease) 05/17/2007   STRESS TEST- EKG negative for ischemia   Cancer (HCC)    Dyslipidemia    Dysrhythmia    Hypothyroidism    PVC (premature ventricular contraction) 12/07/2010   2D ECHO- EF >55%    Past Surgical History:  Procedure Laterality Date   CARDIAC CATHETERIZATION Left 07/25/2002   No significant CAD    MEDICATIONS:  acetaminophen (TYLENOL) 650 MG CR tablet   atorvastatin (LIPITOR) 20 MG tablet   celecoxib (CELEBREX) 200 MG capsule   diltiazem (TIAZAC) 120 MG 24 hr capsule    levothyroxine (SYNTHROID, LEVOTHROID) 137 MCG tablet   sertraline (ZOLOFT) 100 MG tablet   No current facility-administered medications for this encounter.   Marcille Blanco [facility] Short Stay/Anesthesiology [facility] Phone [phone] 12/03/2023 11:46 AM

## 2023-12-06 ENCOUNTER — Ambulatory Visit (HOSPITAL_COMMUNITY)

## 2023-12-06 ENCOUNTER — Other Ambulatory Visit: Payer: Self-pay

## 2023-12-06 ENCOUNTER — Ambulatory Visit (HOSPITAL_COMMUNITY): Payer: Self-pay | Admitting: Anesthesiology

## 2023-12-06 ENCOUNTER — Ambulatory Visit (HOSPITAL_COMMUNITY)
Admission: RE | Admit: 2023-12-06 | Discharge: 2023-12-07 | Disposition: A | Discharge status: Home / Self Care | Payer: Medicare Other | Attending: Orthopedic Surgery | Admitting: Orthopedic Surgery

## 2023-12-06 ENCOUNTER — Encounter (HOSPITAL_COMMUNITY)
Admission: RE | Disposition: A | Discharge status: Home / Self Care | Payer: Self-pay | Source: Home / Self Care | Attending: Orthopedic Surgery

## 2023-12-06 ENCOUNTER — Encounter (HOSPITAL_COMMUNITY): Payer: Self-pay | Admitting: Orthopedic Surgery

## 2023-12-06 ENCOUNTER — Ambulatory Visit (HOSPITAL_COMMUNITY): Payer: Self-pay | Admitting: Medical

## 2023-12-06 DIAGNOSIS — E039 Hypothyroidism, unspecified: Secondary | ICD-10-CM

## 2023-12-06 DIAGNOSIS — I10 Essential (primary) hypertension: Secondary | ICD-10-CM | POA: Insufficient documentation

## 2023-12-06 DIAGNOSIS — M1612 Unilateral primary osteoarthritis, left hip: Secondary | ICD-10-CM | POA: Insufficient documentation

## 2023-12-06 DIAGNOSIS — Z79899 Other long term (current) drug therapy: Secondary | ICD-10-CM | POA: Insufficient documentation

## 2023-12-06 DIAGNOSIS — Z87891 Personal history of nicotine dependence: Secondary | ICD-10-CM | POA: Insufficient documentation

## 2023-12-06 HISTORY — PX: TOTAL HIP ARTHROPLASTY: SHX124

## 2023-12-06 LAB — ABO/RH: ABO/RH(D): A POS

## 2023-12-06 LAB — TYPE AND SCREEN
ABO/RH(D): A POS
Antibody Screen: NEGATIVE

## 2023-12-06 SURGERY — ARTHROPLASTY, HIP, TOTAL, ANTERIOR APPROACH
Anesthesia: Spinal | Site: Hip | Laterality: Left

## 2023-12-06 MED ORDER — ACETAMINOPHEN 500 MG PO TABS
500.0000 mg | ORAL_TABLET | Freq: Four times a day (QID) | ORAL | Status: AC
  Administered 2023-12-06 – 2023-12-07 (×3): 500 mg via ORAL
  Filled 2023-12-06 (×3): qty 1

## 2023-12-06 MED ORDER — SENNA 8.6 MG PO TABS
1.0000 | ORAL_TABLET | Freq: Two times a day (BID) | ORAL | Status: DC
  Administered 2023-12-06 – 2023-12-07 (×2): 8.6 mg via ORAL
  Filled 2023-12-06 (×2): qty 1

## 2023-12-06 MED ORDER — LACTATED RINGERS IV SOLN: INTRAVENOUS | Status: DC

## 2023-12-06 MED ORDER — DILTIAZEM HCL ER COATED BEADS 120 MG PO CP24
120.0000 mg | ORAL_CAPSULE | Freq: Every day | ORAL | Status: DC
  Administered 2023-12-07: 120 mg via ORAL
  Filled 2023-12-06: qty 1

## 2023-12-06 MED ORDER — METOCLOPRAMIDE HCL 5 MG/ML IJ SOLN: 5.0000 mg | Freq: Three times a day (TID) | INTRAMUSCULAR | Status: DC | PRN

## 2023-12-06 MED ORDER — ORAL CARE MOUTH RINSE: 15.0000 mL | Freq: Once | OROMUCOSAL | Status: AC

## 2023-12-06 MED ORDER — DEXAMETHASONE SODIUM PHOSPHATE 10 MG/ML IJ SOLN
INTRAMUSCULAR | Status: AC
  Filled 2023-12-06: qty 1

## 2023-12-06 MED ORDER — ACETAMINOPHEN 500 MG PO TABS
1000.0000 mg | ORAL_TABLET | Freq: Once | ORAL | Status: AC
  Administered 2023-12-06: 1000 mg via ORAL
  Filled 2023-12-06: qty 2

## 2023-12-06 MED ORDER — PROPOFOL 1000 MG/100ML IV EMUL
INTRAVENOUS | Status: AC
  Filled 2023-12-06: qty 100

## 2023-12-06 MED ORDER — DEXAMETHASONE SODIUM PHOSPHATE 10 MG/ML IJ SOLN
10.0000 mg | Freq: Once | INTRAMUSCULAR | Status: AC
  Administered 2023-12-07: 10 mg via INTRAVENOUS
  Filled 2023-12-06: qty 1

## 2023-12-06 MED ORDER — BUPIVACAINE-EPINEPHRINE (PF) 0.25% -1:200000 IJ SOLN
INTRAMUSCULAR | Status: AC
  Filled 2023-12-06: qty 30

## 2023-12-06 MED ORDER — HYDROCODONE-ACETAMINOPHEN 7.5-325 MG PO TABS: 1.0000 | ORAL_TABLET | ORAL | Status: DC | PRN

## 2023-12-06 MED ORDER — DEXMEDETOMIDINE HCL IN NACL 80 MCG/20ML IV SOLN
INTRAVENOUS | Status: AC
  Filled 2023-12-06: qty 20

## 2023-12-06 MED ORDER — BUPIVACAINE IN DEXTROSE 0.75-8.25 % IT SOLN
INTRATHECAL | Status: DC | PRN
  Administered 2023-12-06: 12 mg via INTRATHECAL

## 2023-12-06 MED ORDER — DILTIAZEM HCL ER BEADS 120 MG PO CP24: 120.0000 mg | ORAL_CAPSULE | Freq: Every day | ORAL | Status: DC

## 2023-12-06 MED ORDER — MIDAZOLAM HCL 2 MG/2ML IJ SOLN: 0.5000 mg | Freq: Once | INTRAMUSCULAR | Status: DC | PRN

## 2023-12-06 MED ORDER — PHENYLEPHRINE 80 MCG/ML (10ML) SYRINGE FOR IV PUSH (FOR BLOOD PRESSURE SUPPORT)
PREFILLED_SYRINGE | INTRAVENOUS | Status: AC
  Filled 2023-12-06: qty 10

## 2023-12-06 MED ORDER — WATER FOR IRRIGATION, STERILE IR SOLN
Status: DC | PRN
  Administered 2023-12-06: 1000 mL

## 2023-12-06 MED ORDER — ONDANSETRON HCL 4 MG/2ML IJ SOLN: 4.0000 mg | Freq: Four times a day (QID) | INTRAMUSCULAR | Status: DC | PRN

## 2023-12-06 MED ORDER — FENTANYL CITRATE PF 50 MCG/ML IJ SOSY: 25.0000 ug | PREFILLED_SYRINGE | INTRAMUSCULAR | Status: DC | PRN

## 2023-12-06 MED ORDER — FENTANYL CITRATE (PF) 100 MCG/2ML IJ SOLN
INTRAMUSCULAR | Status: AC
  Filled 2023-12-06: qty 2

## 2023-12-06 MED ORDER — LEVOTHYROXINE SODIUM 25 MCG PO TABS
137.0000 ug | ORAL_TABLET | Freq: Every day | ORAL | Status: DC
  Administered 2023-12-07: 137 ug via ORAL
  Filled 2023-12-06: qty 1

## 2023-12-06 MED ORDER — METOCLOPRAMIDE HCL 5 MG PO TABS: 5.0000 mg | ORAL_TABLET | Freq: Three times a day (TID) | ORAL | Status: DC | PRN

## 2023-12-06 MED ORDER — ALBUMIN HUMAN 5 % IV SOLN
INTRAVENOUS | Status: AC
  Filled 2023-12-06: qty 250

## 2023-12-06 MED ORDER — SODIUM CHLORIDE (PF) 0.9 % IJ SOLN
INTRAMUSCULAR | Status: AC
  Filled 2023-12-06: qty 30

## 2023-12-06 MED ORDER — FENTANYL CITRATE (PF) 100 MCG/2ML IJ SOLN
INTRAMUSCULAR | Status: DC | PRN
  Administered 2023-12-06: 12.5 ug via INTRAVENOUS
  Administered 2023-12-06: 50 ug via INTRAVENOUS
  Administered 2023-12-06 (×2): 12.5 ug via INTRAVENOUS

## 2023-12-06 MED ORDER — POLYETHYLENE GLYCOL 3350 17 G PO PACK: 17.0000 g | PACK | Freq: Every day | ORAL | Status: DC | PRN

## 2023-12-06 MED ORDER — MIDAZOLAM HCL 2 MG/2ML IJ SOLN
INTRAMUSCULAR | Status: AC
  Filled 2023-12-06: qty 2

## 2023-12-06 MED ORDER — TRANEXAMIC ACID-NACL 1000-0.7 MG/100ML-% IV SOLN
1000.0000 mg | INTRAVENOUS | Status: AC
  Administered 2023-12-06: 1000 mg via INTRAVENOUS
  Filled 2023-12-06: qty 100

## 2023-12-06 MED ORDER — SODIUM CHLORIDE (PF) 0.9 % IJ SOLN
INTRAMUSCULAR | Status: DC | PRN
  Administered 2023-12-06: 61 mL

## 2023-12-06 MED ORDER — LIDOCAINE HCL (CARDIAC) PF 100 MG/5ML IV SOSY
PREFILLED_SYRINGE | INTRAVENOUS | Status: DC | PRN
  Administered 2023-12-06: 20 mg via INTRAVENOUS

## 2023-12-06 MED ORDER — GLYCOPYRROLATE 0.2 MG/ML IJ SOLN
INTRAMUSCULAR | Status: AC
  Filled 2023-12-06: qty 1

## 2023-12-06 MED ORDER — DIPHENHYDRAMINE HCL 12.5 MG/5ML PO ELIX: 12.5000 mg | ORAL_SOLUTION | ORAL | Status: DC | PRN

## 2023-12-06 MED ORDER — METHOCARBAMOL 500 MG PO TABS
500.0000 mg | ORAL_TABLET | Freq: Four times a day (QID) | ORAL | Status: DC | PRN
  Administered 2023-12-06 – 2023-12-07 (×2): 500 mg via ORAL
  Filled 2023-12-06 (×2): qty 1

## 2023-12-06 MED ORDER — SODIUM CHLORIDE 0.9 % IR SOLN
Status: DC | PRN
  Administered 2023-12-06: 1000 mL

## 2023-12-06 MED ORDER — ISOPROPYL ALCOHOL 70 % SOLN
Status: DC | PRN
  Administered 2023-12-06: 1 via TOPICAL

## 2023-12-06 MED ORDER — ONDANSETRON HCL 4 MG/2ML IJ SOLN
INTRAMUSCULAR | Status: DC | PRN
  Administered 2023-12-06: 4 mg via INTRAVENOUS

## 2023-12-06 MED ORDER — POVIDONE-IODINE 10 % EX SWAB: 2.0000 | Freq: Once | CUTANEOUS | Status: DC

## 2023-12-06 MED ORDER — ASPIRIN 81 MG PO CHEW
81.0000 mg | CHEWABLE_TABLET | Freq: Two times a day (BID) | ORAL | Status: DC
  Administered 2023-12-06 – 2023-12-07 (×2): 81 mg via ORAL
  Filled 2023-12-06 (×2): qty 1

## 2023-12-06 MED ORDER — EPHEDRINE 5 MG/ML INJ
INTRAVENOUS | Status: AC
  Filled 2023-12-06: qty 5

## 2023-12-06 MED ORDER — DOCUSATE SODIUM 100 MG PO CAPS
100.0000 mg | ORAL_CAPSULE | Freq: Two times a day (BID) | ORAL | Status: DC
  Administered 2023-12-06 – 2023-12-07 (×2): 100 mg via ORAL
  Filled 2023-12-06 (×2): qty 1

## 2023-12-06 MED ORDER — PHENYLEPHRINE HCL-NACL 20-0.9 MG/250ML-% IV SOLN
INTRAVENOUS | Status: DC | PRN
Start: 2023-12-06 — End: 2023-12-06
  Administered 2023-12-06: 30 ug/min via INTRAVENOUS

## 2023-12-06 MED ORDER — KETOROLAC TROMETHAMINE 30 MG/ML IJ SOLN
INTRAMUSCULAR | Status: AC
  Filled 2023-12-06: qty 1

## 2023-12-06 MED ORDER — OXYCODONE HCL 5 MG/5ML PO SOLN: 5.0000 mg | Freq: Once | ORAL | Status: DC | PRN

## 2023-12-06 MED ORDER — ACETAMINOPHEN 500 MG PO TABS
ORAL_TABLET | ORAL | Status: AC
  Filled 2023-12-06: qty 1

## 2023-12-06 MED ORDER — ACETAMINOPHEN 325 MG PO TABS: 325.0000 mg | ORAL_TABLET | Freq: Four times a day (QID) | ORAL | Status: DC | PRN

## 2023-12-06 MED ORDER — CEFAZOLIN SODIUM-DEXTROSE 2-4 GM/100ML-% IV SOLN
2.0000 g | Freq: Four times a day (QID) | INTRAVENOUS | Status: AC
  Administered 2023-12-06 (×2): 2 g via INTRAVENOUS
  Filled 2023-12-06 (×2): qty 100

## 2023-12-06 MED ORDER — HYDROCODONE-ACETAMINOPHEN 5-325 MG PO TABS
1.0000 | ORAL_TABLET | ORAL | Status: DC | PRN
  Administered 2023-12-06 – 2023-12-07 (×2): 1 via ORAL
  Filled 2023-12-06 (×2): qty 2
  Filled 2023-12-06: qty 1

## 2023-12-06 MED ORDER — METHOCARBAMOL 1000 MG/10ML IJ SOLN: 500.0000 mg | Freq: Four times a day (QID) | INTRAMUSCULAR | Status: DC | PRN

## 2023-12-06 MED ORDER — PROPOFOL 500 MG/50ML IV EMUL
INTRAVENOUS | Status: DC | PRN
  Administered 2023-12-06: 50 ug/kg/min via INTRAVENOUS

## 2023-12-06 MED ORDER — PHENOL 1.4 % MT LIQD: 1.0000 | OROMUCOSAL | Status: DC | PRN

## 2023-12-06 MED ORDER — CELECOXIB 200 MG PO CAPS
200.0000 mg | ORAL_CAPSULE | Freq: Two times a day (BID) | ORAL | Status: DC
  Administered 2023-12-06 – 2023-12-07 (×3): 200 mg via ORAL
  Filled 2023-12-06 (×3): qty 1

## 2023-12-06 MED ORDER — SERTRALINE HCL 100 MG PO TABS
100.0000 mg | ORAL_TABLET | Freq: Every day | ORAL | Status: DC
  Administered 2023-12-07: 100 mg via ORAL
  Filled 2023-12-06: qty 1

## 2023-12-06 MED ORDER — MENTHOL 3 MG MT LOZG: 1.0000 | LOZENGE | OROMUCOSAL | Status: DC | PRN

## 2023-12-06 MED ORDER — ONDANSETRON HCL 4 MG PO TABS: 4.0000 mg | ORAL_TABLET | Freq: Four times a day (QID) | ORAL | Status: DC | PRN

## 2023-12-06 MED ORDER — MORPHINE SULFATE (PF) 2 MG/ML IV SOLN: 0.5000 mg | INTRAVENOUS | Status: DC | PRN

## 2023-12-06 MED ORDER — ONDANSETRON HCL 4 MG/2ML IJ SOLN
INTRAMUSCULAR | Status: AC
  Filled 2023-12-06: qty 2

## 2023-12-06 MED ORDER — DEXMEDETOMIDINE HCL IN NACL 80 MCG/20ML IV SOLN
INTRAVENOUS | Status: DC | PRN
Start: 2023-12-06 — End: 2023-12-06
  Administered 2023-12-06 (×3): 4 ug via INTRAVENOUS

## 2023-12-06 MED ORDER — SODIUM CHLORIDE 0.9 % IV SOLN: INTRAVENOUS | Status: DC

## 2023-12-06 MED ORDER — ALBUMIN HUMAN 5 % IV SOLN: INTRAVENOUS | Status: DC | PRN

## 2023-12-06 MED ORDER — ATORVASTATIN CALCIUM 20 MG PO TABS
20.0000 mg | ORAL_TABLET | Freq: Every day | ORAL | Status: DC
  Administered 2023-12-06 – 2023-12-07 (×2): 20 mg via ORAL
  Filled 2023-12-06 (×2): qty 1

## 2023-12-06 MED ORDER — OXYCODONE HCL 5 MG PO TABS: 5.0000 mg | ORAL_TABLET | Freq: Once | ORAL | Status: DC | PRN

## 2023-12-06 MED ORDER — DEXAMETHASONE SODIUM PHOSPHATE 10 MG/ML IJ SOLN
INTRAMUSCULAR | Status: DC | PRN
  Administered 2023-12-06 (×2): 4 mg via INTRAVENOUS

## 2023-12-06 MED ORDER — ALUM & MAG HYDROXIDE-SIMETH 200-200-20 MG/5ML PO SUSP: 30.0000 mL | ORAL | Status: DC | PRN

## 2023-12-06 MED ORDER — CHLORHEXIDINE GLUCONATE 0.12 % MT SOLN
15.0000 mL | Freq: Once | OROMUCOSAL | Status: AC
  Administered 2023-12-06: 15 mL via OROMUCOSAL

## 2023-12-06 MED ORDER — MEPERIDINE HCL 50 MG/ML IJ SOLN: 6.2500 mg | INTRAMUSCULAR | Status: DC | PRN

## 2023-12-06 MED ORDER — CEFAZOLIN SODIUM-DEXTROSE 2-4 GM/100ML-% IV SOLN
2.0000 g | INTRAVENOUS | Status: AC
Start: 2023-12-06 — End: 2023-12-06
  Administered 2023-12-06: 2 g via INTRAVENOUS
  Filled 2023-12-06: qty 100

## 2023-12-06 SURGICAL SUPPLY — 48 items
ACETAB SHELL 4H 54 F HIP (Shell) ×1 IMPLANT
BAG COUNTER SPONGE SURGICOUNT (BAG) IMPLANT
BAG ZIPLOCK 12X15 (MISCELLANEOUS) IMPLANT
CHLORAPREP W/TINT 26 (MISCELLANEOUS) ×1 IMPLANT
COVER PERINEAL POST (MISCELLANEOUS) ×1 IMPLANT
COVER SURGICAL LIGHT HANDLE (MISCELLANEOUS) ×1 IMPLANT
DERMABOND ADVANCED .7 DNX12 (GAUZE/BANDAGES/DRESSINGS) ×2 IMPLANT
DRAPE IMP U-DRAPE 54X76 (DRAPES) ×1 IMPLANT
DRAPE SHEET LG 3/4 BI-LAMINATE (DRAPES) ×3 IMPLANT
DRAPE STERI IOBAN 125X83 (DRAPES) ×1 IMPLANT
DRAPE U-SHAPE 47X51 STRL (DRAPES) ×1 IMPLANT
DRSG AQUACEL AG ADV 3.5X10 (GAUZE/BANDAGES/DRESSINGS) ×1 IMPLANT
ELECT REM PT RETURN 15FT ADLT (MISCELLANEOUS) ×1 IMPLANT
GAUZE SPONGE 4X4 12PLY STRL (GAUZE/BANDAGES/DRESSINGS) ×1 IMPLANT
GLOVE BIO SURGEON STRL SZ7 (GLOVE) ×1 IMPLANT
GLOVE BIO SURGEON STRL SZ8.5 (GLOVE) ×2 IMPLANT
GLOVE BIOGEL PI IND STRL 7.5 (GLOVE) ×1 IMPLANT
GLOVE BIOGEL PI IND STRL 8.5 (GLOVE) ×1 IMPLANT
GOWN SPEC L3 XXLG W/TWL (GOWN DISPOSABLE) ×1 IMPLANT
GOWN STRL REUS W/ TWL XL LVL3 (GOWN DISPOSABLE) ×1 IMPLANT
HEAD FEM -3XOFST 36XMDLR (Head) IMPLANT
HOLDER FOLEY CATH W/STRAP (MISCELLANEOUS) ×1 IMPLANT
HOOD PEEL AWAY T7 (MISCELLANEOUS) ×3 IMPLANT
KIT TURNOVER KIT A (KITS) IMPLANT
LINER ACETAB NN G7 F 36 (Liner) IMPLANT
MANIFOLD NEPTUNE II (INSTRUMENTS) ×1 IMPLANT
MARKER SKIN DUAL TIP RULER LAB (MISCELLANEOUS) ×1 IMPLANT
NDL SAFETY ECLIPSE 18X1.5 (NEEDLE) ×1 IMPLANT
NDL SPNL 18GX3.5 QUINCKE PK (NEEDLE) ×1 IMPLANT
NEEDLE SPNL 18GX3.5 QUINCKE PK (NEEDLE) ×1 IMPLANT
PACK ANTERIOR HIP CUSTOM (KITS) ×1 IMPLANT
SAW OSC TIP CART 19.5X105X1.3 (SAW) ×1 IMPLANT
SEALER BIPOLAR AQUA 6.0 (INSTRUMENTS) ×1 IMPLANT
SET HNDPC FAN SPRY TIP SCT (DISPOSABLE) ×1 IMPLANT
SHELL ACETAB 54 4H HIP (Shell) IMPLANT
SOLUTION PRONTOSAN WOUND 350ML (IRRIGATION / IRRIGATOR) ×1 IMPLANT
SPIKE FLUID TRANSFER (MISCELLANEOUS) ×1 IMPLANT
STEM FEM SZ18 121X42.6 133D (Stem) IMPLANT
SUT MNCRL AB 3-0 PS2 18 (SUTURE) ×1 IMPLANT
SUT MON AB 2-0 CT1 36 (SUTURE) ×1 IMPLANT
SUT STRATAFIX 14 PDO 48 VLT (SUTURE) ×1 IMPLANT
SUT STRATAFIX PDO 1 14 VIOLET (SUTURE) ×1 IMPLANT
SUT VIC AB 2-0 CT1 TAPERPNT 27 (SUTURE) IMPLANT
SYR 3ML LL SCALE MARK (SYRINGE) ×1 IMPLANT
TOWEL GREEN STERILE FF (TOWEL DISPOSABLE) ×1 IMPLANT
TRAY FOLEY MTR SLVR 16FR STAT (SET/KITS/TRAYS/PACK) IMPLANT
TUBE SUCTION HIGH CAP CLEAR NV (SUCTIONS) ×1 IMPLANT
WATER STERILE IRR 1000ML POUR (IV SOLUTION) ×1 IMPLANT

## 2023-12-06 NOTE — Plan of Care (Signed)
  Problem: Education: Goal: Knowledge of General Education information will improve Description Including pain rating scale, medication(s)/side effects and non-pharmacologic comfort measures Outcome: Progressing   Problem: Education: Goal: Knowledge of the prescribed therapeutic regimen will improve Outcome: Progressing

## 2023-12-06 NOTE — Discharge Instructions (Signed)

## 2023-12-06 NOTE — Plan of Care (Signed)
  Problem: Education: Goal: Knowledge of General Education information will improve Description: Including pain rating scale, medication(s)/side effects and non-pharmacologic comfort measures Outcome: Progressing   Problem: Health Behavior/Discharge Planning: Goal: Ability to manage health-related needs will improve Outcome: Progressing   Problem: Clinical Measurements: Goal: Ability to maintain clinical measurements within normal limits will improve Outcome: Progressing Goal: Will remain free from infection Outcome: Progressing Goal: Diagnostic test results will improve Outcome: Progressing Goal: Respiratory complications will improve Outcome: Progressing Goal: Cardiovascular complication will be avoided Outcome: Progressing   Problem: Activity: Goal: Risk for activity intolerance will decrease Outcome: Adequate for Discharge   Problem: Nutrition: Goal: Adequate nutrition will be maintained Outcome: Completed/Met   Problem: Coping: Goal: Level of anxiety will decrease Outcome: Progressing   Problem: Elimination: Goal: Will not experience complications related to bowel motility Outcome: Progressing Goal: Will not experience complications related to urinary retention Outcome: Progressing   Problem: Pain Managment: Goal: General experience of comfort will improve and/or be controlled Outcome: Progressing   Problem: Safety: Goal: Ability to remain free from injury will improve Outcome: Progressing   Problem: Skin Integrity: Goal: Risk for impaired skin integrity will decrease Outcome: Progressing   Problem: Education: Goal: Knowledge of the prescribed therapeutic regimen will improve Outcome: Progressing Goal: Understanding of discharge needs will improve Outcome: Progressing Goal: Individualized Educational Video(s) Outcome: Completed/Met   Problem: Activity: Goal: Ability to avoid complications of mobility impairment will improve Outcome: Progressing Goal:  Ability to tolerate increased activity will improve Outcome: Adequate for Discharge   Problem: Clinical Measurements: Goal: Postoperative complications will be avoided or minimized Outcome: Progressing   Problem: Pain Management: Goal: Pain level will decrease with appropriate interventions Outcome: Progressing   Problem: Skin Integrity: Goal: Will show signs of wound healing Outcome: Progressing   Problem: Education: Goal: Knowledge of the prescribed therapeutic regimen will improve Outcome: Progressing Goal: Understanding of discharge needs will improve Outcome: Progressing Goal: Individualized Educational Video(s) Outcome: Completed/Met

## 2023-12-06 NOTE — Anesthesia Procedure Notes (Signed)
 Spinal  Patient location during procedure: OR End time: 12/06/2023 7:47 AM Reason for block: surgical anesthesia Staffing Performed: anesthesiologist  Anesthesiologist: Jairo Ben, MD Performed by: Jairo Ben, MD Authorized by: Jairo Ben, MD   Preanesthetic Checklist Completed: patient identified, IV checked, site marked, risks and benefits discussed, surgical consent, monitors and equipment checked, pre-op evaluation and timeout performed Spinal Block Patient position: sitting Prep: DuraPrep Patient monitoring: heart rate, cardiac monitor, continuous pulse ox and blood pressure Approach: midline Location: L3-4 Injection technique: single-shot Needle Needle type: Pencan and Introducer  Needle gauge: 24 G Needle length: 9 cm Assessment Sensory level: T4 Events: CSF return Additional Notes Pt identified in Operating room.  Monitors applied. Working IV access confirmed. Sterile prep, drape lumbar spine.  1% lido local L 3,4.  #24ga Pencan into clear CSF L 3,4.  12mg  0.75% Bupivacaine with dextrose injected with asp CSF beginning and end of injection.  Patient asymptomatic, VSS, no heme aspirated, tolerated well.  Sandford Craze, MD

## 2023-12-06 NOTE — Anesthesia Postprocedure Evaluation (Signed)
 Anesthesia Post Note  Patient: Paul Huber  Procedure(s) Performed: ARTHROPLASTY, HIP, TOTAL, ANTERIOR APPROACH (Left: Hip)     Patient location during evaluation: PACU Anesthesia Type: Spinal Level of consciousness: oriented, awake and alert and patient cooperative Pain management: pain level controlled Vital Signs Assessment: post-procedure vital signs reviewed and stable Respiratory status: spontaneous breathing, respiratory function stable and nonlabored ventilation Cardiovascular status: blood pressure returned to baseline and stable Postop Assessment: no headache, no backache, no apparent nausea or vomiting and spinal receding Anesthetic complications: no   No notable events documented.  Last Vitals:  Vitals:   12/06/23 1200 12/06/23 1300  BP: (!) 150/91 (!) 155/79  Pulse: 73 78  Resp: 14 14  Temp:    SpO2: 98% 97%    Last Pain:  Vitals:   12/06/23 1200  TempSrc:   PainSc: 0-No pain                 Marieme Mcmackin,E. Jaxxen Voong

## 2023-12-06 NOTE — Evaluation (Signed)
 Physical Therapy Evaluation Patient Details Name: Paul Huber MRN: [id number] DOB: 12-05-1934 Today's Date: 12/06/2023  History of Present Illness  Pt is an 89 year old male s/p Left THA on 12/06/23.  PMHx: arthritis, dysrhythmia, nonischemic cardiomyopathy, PVC  Clinical Impression  Pt is s/p THA resulting in the deficits listed below (see PT Problem List).  Pt will benefit from acute skilled PT to increase their independence and safety with mobility to facilitate discharge.  Pt very pleasant and eager to participate. Pt ambulated 100 ft in hallway POD#0.  Pt has a 3 steps to enter home and will have family assist 24/7 upon d/c.  Pt encouraged to remain in recliner end of session and perform ankle pumps.         If plan is discharge home, recommend the following: A little help with walking and/or transfers;A little help with bathing/dressing/bathroom;Assistance with cooking/housework;Help with stairs or ramp for entrance   Can travel by private vehicle        Equipment Recommendations None recommended by PT  Recommendations for Other Services       Functional Status Assessment Patient has had a recent decline in their functional status and demonstrates the ability to make significant improvements in function in a reasonable and predictable amount of time.     Precautions / Restrictions Precautions Precautions: Fall Restrictions Weight Bearing Restrictions Per Provider Order: No Other Position/Activity Restrictions: WBAT      Mobility  Bed Mobility Overal bed mobility: Needs Assistance Bed Mobility: Supine to Sit     Supine to sit: HOB elevated, Min assist     General bed mobility comments: cues for technique; assist for trunk upright    Transfers Overall transfer level: Needs assistance Equipment used: Rolling walker (2 wheels) Transfers: Sit to/from Stand Sit to Stand: Contact guard assist           General transfer comment: verbal cues for UE and LE  positioning for pain control    Ambulation/Gait Ambulation/Gait assistance: Contact guard assist Gait Distance (Feet): 100 Feet Assistive device: Rolling walker (2 wheels) Gait Pattern/deviations: Step-to pattern, Decreased stance time - left, Antalgic       General Gait Details: verbal cues for sequence, step length, RW positioning  Stairs            Wheelchair Mobility     Tilt Bed    Modified Rankin (Stroke Patients Only)       Balance Overall balance assessment: Mild deficits observed, not formally tested (spouse reports some issues with balance prior to surgery)                                           Pertinent Vitals/Pain Pain Assessment Pain Assessment: 0-10 Pain Score: 2  Pain Location: left hip Pain Descriptors / Indicators: Aching, Sore Pain Intervention(s): Repositioned, Monitored during session, Ice applied    Home Living Family/patient expects to be discharged to:: Private residence Living Arrangements: Spouse/significant other Available Help at Discharge: Family;Available 24 hours/day Type of Home: House Home Access: Stairs to enter Entrance Stairs-Rails: Can reach both;Left;Right Entrance Stairs-Number of Steps: 3   Home Layout: Able to live on main level with bedroom/bathroom Home Equipment: Agricultural consultant (2 wheels);Rollator (4 wheels);Cane - single point      Prior Function Prior Level of Function : Independent/Modified Independent  Mobility Comments: was using [facility]       Extremity/Trunk Assessment        Lower Extremity Assessment Lower Extremity Assessment: LLE deficits/detail LLE Deficits / Details: anticipated post op hip weakness observed, able to perform bil ankle pumps       Communication   Communication Factors Affecting Communication: Hearing impaired    Cognition Arousal: Alert Behavior During Therapy: WFL for tasks assessed/performed   PT - Cognitive impairments: No  apparent impairments                         Following commands: Intact       Cueing       General Comments      Exercises     Assessment/Plan    PT Assessment Patient needs continued PT services  PT Problem List Decreased mobility;Decreased activity tolerance;Decreased strength;Pain;Decreased knowledge of use of DME       PT Treatment Interventions Stair training;Gait training;DME instruction;Balance training;Functional mobility training;Therapeutic activities;Therapeutic exercise;Patient/family education;Modalities    PT Goals (Current goals can be found in the Care Plan section)  Acute Rehab PT Goals PT Goal Formulation: With patient/family Time For Goal Achievement: 12/13/23 Potential to Achieve Goals: Good    Frequency 7X/week     Co-evaluation               AM-PAC PT "6 Clicks" Mobility  Outcome Measure Help needed turning from your back to your side while in a flat bed without using bedrails?: A Little Help needed moving from lying on your back to sitting on the side of a flat bed without using bedrails?: A Little Help needed moving to and from a bed to a chair (including a wheelchair)?: A Little Help needed standing up from a chair using your arms (e.g., wheelchair or bedside chair)?: A Little Help needed to walk in hospital room?: A Little Help needed climbing 3-5 steps with a railing? : A Lot 6 Click Score: 17    End of Session Equipment Utilized During Treatment: Gait belt Activity Tolerance: Patient tolerated treatment well Patient left: in chair;with call bell/phone within reach;with chair alarm set;with family/visitor present   PT Visit Diagnosis: Difficulty in walking, not elsewhere classified (R26.2)    Time: 2595-6387 PT Time Calculation (min) (ACUTE ONLY): 20 min   Charges:   PT Evaluation $PT Eval Low Complexity: 1 Low   PT General Charges $$ ACUTE PT VISIT: 1 Visit        Paul Huber, DPT Physical Therapist Acute  Rehabilitation Services Office: [phone]  Paul Huber [place] 12/06/2023, 4:26 PM

## 2023-12-06 NOTE — Op Note (Signed)
 OPERATIVE REPORT  SURGEON: Samson Frederic, MD   ASSISTANT: Skip Mayer, PA-C.  PREOPERATIVE DIAGNOSIS: Left hip arthritis.   POSTOPERATIVE DIAGNOSIS: Left hip arthritis.   PROCEDURE: Left total hip arthroplasty, anterior approach.   IMPLANTS: Biomet Taperloc Complete Microplasty stem, size 18 x 121 mm, high offset. Biomet G7 OsseoTi Cup, size 54 mm. Biomet Vivacit-E liner, size 36 mm, F, neutral. Biomet metal head ball, size 36 - 3 mm.  ANESTHESIA:  MAC and Spinal  ESTIMATED BLOOD LOSS:-450 mL    ANTIBIOTICS: 2g Ancef.  DRAINS: None.  COMPLICATIONS: None.   CONDITION: PACU - hemodynamically stable.   BRIEF CLINICAL NOTE: Paul Huber is a 89 y.o. male with a long-standing history of Left hip arthritis. After failing conservative management, the patient was indicated for total hip arthroplasty. The risks, benefits, and alternatives to the procedure were explained, and the patient elected to proceed.  PROCEDURE IN DETAIL: Surgical site was marked by myself in the pre-op holding area. Once inside the operating room, spinal anesthesia was obtained, and a foley catheter was inserted. The patient was then positioned on the Hana table.  All bony prominences were well padded.  The hip was prepped and draped in the normal sterile surgical fashion.  A time-out was called verifying side and site of surgery. The patient received IV antibiotics within 60 minutes of beginning the procedure.   Bikini incision was made, and superficial dissection was performed lateral to the ASIS. The direct anterior approach to the hip was performed through the Hueter interval.  Lateral femoral circumflex vessels were treated with the Auqumantys. The anterior capsule was exposed and an inverted T capsulotomy was made. The femoral neck cut was made to the level of the templated cut.  A corkscrew was placed into the head and the head was removed.  The femoral head was found to have eburnated bone. The  head was passed to the back table and was measured. Pubofemoral ligament was released off of the calcar, taking care to stay on bone. Superior capsule was released from the greater trochanter, taking care to stay lateral to the posterior border of the femoral neck in order to preserve the short external rotators.   Acetabular exposure was achieved, and the pulvinar and labrum were excised. Sequential reaming of the acetabulum was then performed up to a size 53 mm reamer. A 54 mm cup was then opened and impacted into place at approximately 40 degrees of abduction and 20 degrees of anteversion. The final polyethylene liner was impacted into place and acetabular osteophytes were removed.    I then gained femoral exposure taking care to protect the abductors and greater trochanter.  This was performed using standard external rotation, extension, and adduction.  A cookie cutter was used to enter the femoral canal, and then the femoral canal finder was placed.  Sequential broaching was performed up to a size 18.  Calcar planer was used on the femoral neck remnant.  I placed a high offset neck and a trial head ball.  The hip was reduced.  Leg lengths and offset were checked fluoroscopically.  The hip was dislocated and trial components were removed.  The final implants were placed, and the hip was reduced.  Fluoroscopy was used to confirm component position and leg lengths.  At 90 degrees of external rotation and full extension, the hip was stable to an anterior directed force.   The wound was copiously irrigated with Prontosan solution and normal saline using pulse lavage.  Marcaine  solution was injected into the periarticular soft tissue.  The wound was closed in layers using #1 Vicryl and V-Loc for the fascia, 2-0 Vicryl for the subcutaneous fat, 2-0 Monocryl for the deep dermal layer, 3-0 running Monocryl subcuticular stitch, and Dermabond for the skin.  Once the glue was fully dried, an Aquacell Ag dressing was  applied.  The patient was transported to the recovery room in stable condition.  Sponge, needle, and instrument counts were correct at the end of the case x2.  The patient tolerated the procedure well and there were no known complications.  The aquamantis was utilized for this case to help facilitate better hemostasis as patient was felt to be at increased risk of bleeding because of complex case requiring increased OR time and/or exposure.  A oscillating saw tip was utilized for this case to prevent damage to the soft tissue structures such as muscles, ligaments and tendons, and to ensure accurate bone cuts. This patient was at increased risk for above structures due to  minimally invasive approach.  Please note that a surgical assistant was a medical necessity for this procedure to perform it in a safe and expeditious manner. Assistant was necessary to provide appropriate retraction of vital neurovascular structures, to prevent femoral fracture, and to allow for anatomic placement of the prosthesis.

## 2023-12-06 NOTE — Transfer of Care (Signed)
 Immediate Anesthesia Transfer of Care Note  Patient: Paul Huber  Procedure(s) Performed: ARTHROPLASTY, HIP, TOTAL, ANTERIOR APPROACH (Left: Hip)  Patient Location: PACU  Anesthesia Type:Spinal  Level of Consciousness: awake, alert , oriented, and patient cooperative  Airway & Oxygen Therapy: Patient Spontanous Breathing and Patient connected to face mask oxygen  Post-op Assessment: Report given to RN and Post -op Vital signs reviewed and stable  Post vital signs: Reviewed and stable  Last Vitals:  Vitals Value Taken Time  BP 126/72 12/06/23 1011  Temp    Pulse 64 12/06/23 1014  Resp 12 12/06/23 1014  SpO2 99 % 12/06/23 1014  Vitals shown include unfiled device data.  Last Pain:  Vitals:   12/06/23 0642  TempSrc:   PainSc: 0-No pain         Complications: No notable events documented.

## 2023-12-06 NOTE — Interval H&P Note (Signed)
 History and Physical Interval Note:  12/06/2023 7:19 AM  Paul Huber  has presented today for surgery, with the diagnosis of Left hip osteoarthritis.  The various methods of treatment have been discussed with the patient and family. After consideration of risks, benefits and other options for treatment, the patient has consented to  Procedure(s): ARTHROPLASTY, HIP, TOTAL, ANTERIOR APPROACH (Left) as a surgical intervention.  The patient's history has been reviewed, patient examined, no change in status, stable for surgery.  I have reviewed the patient's chart and labs.  Questions were answered to the patient's satisfaction.     [name]

## 2023-12-06 NOTE — Plan of Care (Signed)
  Problem: Education: Goal: Knowledge of General Education information will improve Description: Including pain rating scale, medication(s)/side effects and non-pharmacologic comfort measures Outcome: Progressing   Problem: Nutrition: Goal: Adequate nutrition will be maintained Outcome: Progressing   Problem: Pain Managment: Goal: General experience of comfort will improve and/or be controlled Outcome: Progressing

## 2023-12-07 ENCOUNTER — Other Ambulatory Visit (HOSPITAL_COMMUNITY): Payer: Self-pay

## 2023-12-07 ENCOUNTER — Encounter (HOSPITAL_COMMUNITY): Payer: Self-pay | Admitting: Orthopedic Surgery

## 2023-12-07 DIAGNOSIS — M1612 Unilateral primary osteoarthritis, left hip: Secondary | ICD-10-CM | POA: Diagnosis not present

## 2023-12-07 LAB — BASIC METABOLIC PANEL
Anion gap: 10 (ref 5–15)
BUN: 19 mg/dL (ref 8–23)
CO2: 22 mmol/L (ref 22–32)
Calcium: 7.8 mg/dL — ABNORMAL LOW (ref 8.9–10.3)
Chloride: 107 mmol/L (ref 98–111)
Creatinine, Ser: 1 mg/dL (ref 0.61–1.24)
GFR, Estimated: >60 mL/min (ref >60)
Glucose, Bld: 142 mg/dL — ABNORMAL HIGH (ref 70–99)
Potassium: 3.8 mmol/L (ref 3.5–5.1)
Sodium: 139 mmol/L (ref 135–145)

## 2023-12-07 LAB — CBC
HCT: 27.6 % — ABNORMAL LOW (ref 39.0–52.0)
Hemoglobin: 9 g/dL — ABNORMAL LOW (ref 13.0–17.0)
MCH: 32.7 pg (ref 26.0–34.0)
MCHC: 32.6 g/dL (ref 30.0–36.0)
MCV: 100.4 fL — ABNORMAL HIGH (ref 80.0–100.0)
Platelets: 130 10*3/uL — ABNORMAL LOW (ref 150–400)
RBC: 2.75 MIL/uL — ABNORMAL LOW (ref 4.22–5.81)
RDW: 13.2 % (ref 11.5–15.5)
WBC: 11.1 10*3/uL — ABNORMAL HIGH (ref 4.0–10.5)
nRBC: 0 % (ref 0.0–0.2)

## 2023-12-07 MED ORDER — POLYETHYLENE GLYCOL 3350 17 GM/SCOOP PO POWD
17.0000 g | Freq: Every day | ORAL | 0 refills | Status: DC | PRN
  Filled 2023-12-07: qty 238, 14d supply, fill #0

## 2023-12-07 MED ORDER — ASPIRIN 81 MG PO CHEW
81.0000 mg | CHEWABLE_TABLET | Freq: Two times a day (BID) | ORAL | 0 refills | Status: AC
  Filled 2023-12-07: qty 90, 45d supply, fill #0

## 2023-12-07 MED ORDER — SENNA 8.6 MG PO TABS
2.0000 | ORAL_TABLET | Freq: Every day | ORAL | 1 refills | Status: AC
  Filled 2023-12-07: qty 60, 30d supply, fill #0

## 2023-12-07 MED ORDER — ONDANSETRON HCL 4 MG PO TABS
4.0000 mg | ORAL_TABLET | Freq: Three times a day (TID) | ORAL | 0 refills | Status: DC | PRN
  Filled 2023-12-07: qty 20, 7d supply, fill #0

## 2023-12-07 MED ORDER — DOCUSATE SODIUM 100 MG PO CAPS
100.0000 mg | ORAL_CAPSULE | Freq: Two times a day (BID) | ORAL | 1 refills | Status: AC
  Filled 2023-12-07: qty 60, 30d supply, fill #0

## 2023-12-07 MED ORDER — HYDROCODONE-ACETAMINOPHEN 5-325 MG PO TABS
1.0000 | ORAL_TABLET | ORAL | 0 refills | Status: DC | PRN
Start: 2023-12-07 — End: 2024-07-04
  Filled 2023-12-07: qty 30, 5d supply, fill #0

## 2023-12-07 NOTE — Discharge Summary (Signed)
 Physician Discharge Summary  Patient ID: Paul Huber MRN: [id number] DOB/AGE: 1934/11/07 89 y.o.  Admit date: 12/06/2023 Discharge date: 12/07/2023  Admission Diagnoses:  Osteoarthritis of left hip  Discharge Diagnoses:  Principal Problem:   Osteoarthritis of left hip   Past Medical History:  Diagnosis Date   Accelerated junctional rhythm    Arthritis    Cancer (HCC)    Dyslipidemia    Dysrhythmia    Hypothyroidism    NICM (nonischemic cardiomyopathy) (HCC)    PVC (premature ventricular contraction) 12/07/2010   2D ECHO- EF >55%    Surgeries: Procedure(s): ARTHROPLASTY, HIP, TOTAL, ANTERIOR APPROACH on 12/06/2023   Consultants (if any):   Discharged Condition: Improved  Hospital Course: Paul Huber is an 89 y.o. male who was admitted 12/06/2023 with a diagnosis of Osteoarthritis of left hip and went to the operating room on 12/06/2023 and underwent the above named procedures.    He was given perioperative antibiotics:  Anti-infectives (From admission, onward)    Start     Dose/Rate Route Frequency Ordered Stop   12/06/23 1400  ceFAZolin (ANCEF) IVPB 2g/100 mL premix        2 g 200 mL/hr over 30 Minutes Intravenous Every 6 hours 12/06/23 1310 12/06/23 2140   12/06/23 0600  ceFAZolin (ANCEF) IVPB 2g/100 mL premix        2 g 200 mL/hr over 30 Minutes Intravenous On call to O.R. 12/06/23 7829 12/06/23 0753       He was given sequential compression devices, early ambulation, and ASA for DVT prophylaxis.  He benefited maximally from the hospital stay and there were no complications.    Recent vital signs:  Vitals:   12/07/23 0509 12/07/23 1322  BP: 121/65 (!) 143/63  Pulse: 74   Resp: 17 15  Temp: 97.7 F (36.5 C) 98.2 F (36.8 C)  SpO2: 91% 95%    Recent laboratory studies:  Lab Results  Component Value Date   HGB 9.0 (L) 12/07/2023   HGB 12.7 (L) 11/30/2023   HGB 14.8 04/12/2009   Lab Results  Component Value Date   WBC 11.1 (H) 12/07/2023   PLT  130 (L) 12/07/2023   Lab Results  Component Value Date   INR 0.9 04/12/2009   Lab Results  Component Value Date   NA 139 12/07/2023   K 3.8 12/07/2023   CL 107 12/07/2023   CO2 22 12/07/2023   BUN 19 12/07/2023   CREATININE 1.00 12/07/2023   GLUCOSE 142 (H) 12/07/2023     Allergies as of 12/07/2023   No Known Allergies      Medication List     STOP taking these medications    acetaminophen 650 MG CR tablet Commonly known as: TYLENOL       TAKE these medications    aspirin 81 MG chewable tablet Commonly known as: Aspirin Childrens Chew 1 tablet (81 mg total) by mouth 2 (two) times daily with a meal.   atorvastatin 20 MG tablet Commonly known as: LIPITOR TAKE ONE TABLET BY MOUTH EVERY DAY   celecoxib 200 MG capsule Commonly known as: CELEBREX Take 200 mg by mouth daily.   diltiazem 120 MG 24 hr capsule Commonly known as: TIAZAC TAKE ONE CAPSULE BY MOUTH EVERY DAY   docusate sodium 100 MG capsule Commonly known as: Colace Take 1 capsule (100 mg total) by mouth 2 (two) times daily.   HYDROcodone-acetaminophen 5-325 MG tablet Commonly known as: NORCO/VICODIN Take 1 tablet by mouth every 4 (four)  hours as needed for moderate pain (pain score 4-6).   levothyroxine 137 MCG tablet Commonly known as: SYNTHROID Take 1 tablet by mouth daily.   ondansetron 4 MG tablet Commonly known as: Zofran Take 1 tablet (4 mg total) by mouth every 8 (eight) hours as needed for nausea or vomiting.   polyethylene glycol 17 g packet Commonly known as: MiraLax Take 17 g by mouth daily as needed for moderate constipation or severe constipation.   senna 8.6 MG Tabs tablet Commonly known as: SENOKOT Take 2 tablets (17.2 mg total) by mouth at bedtime.   sertraline 100 MG tablet Commonly known as: ZOLOFT Take 100 mg by mouth daily.          WEIGHT BEARING   Weight bearing as tolerated with assist device (walker, cane, etc) as directed, use it as long as suggested by  your surgeon or therapist, typically at least 4-6 weeks.   EXERCISES  Results after joint replacement surgery are often greatly improved when you follow the exercise, range of motion and muscle strengthening exercises prescribed by your doctor. Safety measures are also important to protect the joint from further injury. Any time any of these exercises cause you to have increased pain or swelling, decrease what you are doing until you are comfortable again and then slowly increase them. If you have problems or questions, call your caregiver or physical therapist for advice.   Rehabilitation is important following a joint replacement. After just a few days of immobilization, the muscles of the leg can become weakened and shrink (atrophy).  These exercises are designed to build up the tone and strength of the thigh and leg muscles and to improve motion. Often times heat used for twenty to thirty minutes before working out will loosen up your tissues and help with improving the range of motion but do not use heat for the first two weeks following surgery (sometimes heat can increase post-operative swelling).   These exercises can be done on a training (exercise) mat, on the floor, on a table or on a bed. Use whatever works the best and is most comfortable for you.    Use music or television while you are exercising so that the exercises are a pleasant break in your day. This will make your life better with the exercises acting as a break in your routine that you can look forward to.   Perform all exercises about fifteen times, three times per day or as directed.  You should exercise both the operative leg and the other leg as well.  Exercises include:   Quad Sets - Tighten up the muscle on the front of the thigh (Quad) and hold for 5-10 seconds.   Straight Leg Raises - With your knee straight (if you were given a brace, keep it on), lift the leg to 60 degrees, hold for 3 seconds, and slowly lower the leg.   Perform this exercise against resistance later as your leg gets stronger.  Leg Slides: Lying on your back, slowly slide your foot toward your buttocks, bending your knee up off the floor (only go as far as is comfortable). Then slowly slide your foot back down until your leg is flat on the floor again.  Angel Wings: Lying on your back spread your legs to the side as far apart as you can without causing discomfort.  Hamstring Strength:  Lying on your back, push your heel against the floor with your leg straight by tightening up the muscles of  your buttocks.  Repeat, but this time bend your knee to a comfortable angle, and push your heel against the floor.  You may put a pillow under the heel to make it more comfortable if necessary.   A rehabilitation program following joint replacement surgery can speed recovery and prevent re-injury in the future due to weakened muscles. Contact your doctor or a physical therapist for more information on knee rehabilitation.    CONSTIPATION  Constipation is defined medically as fewer than three stools per week and severe constipation as less than one stool per week.  Even if you have a regular bowel pattern at home, your normal regimen is likely to be disrupted due to multiple reasons following surgery.  Combination of anesthesia, postoperative narcotics, change in appetite and fluid intake all can affect your bowels.   YOU MUST use at least one of the following options; they are listed in order of increasing strength to get the job done.  They are all available over the counter, and you may need to use some, POSSIBLY even all of these options:    Drink plenty of fluids (prune juice may be helpful) and high fiber foods Colace 100 mg by mouth twice a day  Senokot for constipation as directed and as needed Dulcolax (bisacodyl), take with full glass of water  Miralax (polyethylene glycol) once or twice a day as needed.  If you have tried all these things and are  unable to have a bowel movement in the first 3-4 days after surgery call either your surgeon or your primary doctor.    If you experience loose stools or diarrhea, hold the medications until you stool forms back up.  If your symptoms do not get better within 1 week or if they get worse, check with your doctor.  If you experience "the worst abdominal pain ever" or develop nausea or vomiting, please contact the office immediately for further recommendations for treatment.   ITCHING:  If you experience itching with your medications, try taking only a single pain pill, or even half a pain pill at a time.  You can also use Benadryl over the counter for itching or also to help with sleep.   TED HOSE STOCKINGS:  Use stockings on both legs until for at least 2 weeks or as directed by physician office. They may be removed at night for sleeping.  MEDICATIONS:  See your medication summary on the "After Visit Summary" that nursing will review with you.  You may have some home medications which will be placed on hold until you complete the course of blood thinner medication.  It is important for you to complete the blood thinner medication as prescribed.  PRECAUTIONS:  If you experience chest pain or shortness of breath - call 911 immediately for transfer to the hospital emergency department.   If you develop a fever greater that 101 F, purulent drainage from wound, increased redness or drainage from wound, foul odor from the wound/dressing, or calf pain - CONTACT YOUR SURGEON.                                                   FOLLOW-UP APPOINTMENTS:  If you do not already have a post-op appointment, please call the office for an appointment to be seen by your surgeon.  Guidelines for how soon to be  seen are listed in your "After Visit Summary", but are typically between 1-4 weeks after surgery.  OTHER INSTRUCTIONS:   Knee Replacement:  Do not place pillow under knee, focus on keeping the knee straight while  resting. CPM instructions: 0-90 degrees, 2 hours in the morning, 2 hours in the afternoon, and 2 hours in the evening. Place foam block, curve side up under heel at all times except when in CPM or when walking.  DO NOT modify, tear, cut, or change the foam block in any way.   MAKE SURE YOU:  Understand these instructions.  Get help right away if you are not doing well or get worse.    Thank you for letting us be a part of your medical care team.  It is a privilege we respect greatly.  We hope these instructions will help you stay on track for a fast and full recovery!   Diagnostic Studies: DG Pelvis Portable Result Date: 12/06/2023 CLINICAL DATA:  Postop. EXAM: PORTABLE PELVIS 1-2 VIEWS COMPARISON:  None Available. FINDINGS: Left hip arthroplasty in expected alignment. No periprosthetic lucency or fracture. Recent postsurgical change includes air and edema in the soft tissues. IMPRESSION: Hip arthroplasty without immediate postoperative complication. Electronically Signed   By: Narda Rutherford M.D.   On: 12/06/2023 10:49   DG HIP UNILAT WITH PELVIS 1V LEFT Result Date: 12/06/2023 CLINICAL DATA:  Elective surgery. EXAM: DG HIP (WITH OR WITHOUT PELVIS) 1V*L* COMPARISON:  None Available. FINDINGS: Two fluoroscopic spot views of the pelvis and left hip obtained in the operating room. Images during hip arthroplasty. Fluoroscopy time 8 seconds. Dose 1.7687 mGy. IMPRESSION: Procedural fluoroscopy during left hip arthroplasty. Electronically Signed   By: Narda Rutherford M.D.   On: 12/06/2023 10:44   DG C-Arm 1-60 Min-No Report Result Date: 12/06/2023 Fluoroscopy was utilized by the requesting physician.  No radiographic interpretation.   DG C-Arm 1-60 Min-No Report Result Date: 12/06/2023 Fluoroscopy was utilized by the requesting physician.  No radiographic interpretation.    Disposition: Discharge disposition: 01-Home or Self Care       Discharge Instructions     Call MD / Call 911   Complete  by: As directed    If you experience chest pain or shortness of breath, CALL 911 and be transported to the hospital emergency room.  If you develope a fever above 101 F, pus (white drainage) or increased drainage or redness at the wound, or calf pain, call your surgeon's office.   Constipation Prevention   Complete by: As directed    Drink plenty of fluids.  Prune juice may be helpful.  You may use a stool softener, such as Colace (over the counter) 100 mg twice a day.  Use MiraLax (over the counter) for constipation as needed.   Diet - low sodium heart healthy   Complete by: As directed    Driving restrictions   Complete by: As directed    No driving for 6 weeks   Follow the hip precautions as taught in Physical Therapy   Complete by: As directed    Increase activity slowly as tolerated   Complete by: As directed    Lifting restrictions   Complete by: As directed    No lifting for 6 weeks   Post-operative opioid taper instructions:   Complete by: As directed    POST-OPERATIVE OPIOID TAPER INSTRUCTIONS: It is important to wean off of your opioid medication as soon as possible. If you do not need pain medication after your  surgery it is ok to stop day one. Opioids include: Codeine, Hydrocodone(Norco, Vicodin), Oxycodone(Percocet, oxycontin) and hydromorphone amongst others.  Long term and even short term use of opiods can cause: Increased pain response Dependence Constipation Depression Respiratory depression And more.  Withdrawal symptoms can include Flu like symptoms Nausea, vomiting And more Techniques to manage these symptoms Hydrate well Eat regular healthy meals Stay active Use relaxation techniques(deep breathing, meditating, yoga) Do Not substitute Alcohol to help with tapering If you have been on opioids for less than two weeks and do not have pain than it is ok to stop all together.  Plan to wean off of opioids This plan should start within one week post op of your  joint replacement. Maintain the same interval or time between taking each dose and first decrease the dose.  Cut the total daily intake of opioids by one tablet each day Next start to increase the time between doses. The last dose that should be eliminated is the evening dose.      TED hose   Complete by: As directed    Use stockings (TED hose) for 2 weeks on both leg(s).  You may remove them at night for sleeping.        Follow-up Information     Clois Dupes, [place]. Schedule an appointment as soon as possible for a visit in 2 week(s).   Specialty: Orthopedic Surgery Why: For wound re-check Contact information: [address] [place] [address] [phone]                  Signed: Iline Oven Deronte Solis 12/07/2023, 2:24 PM

## 2023-12-07 NOTE — Progress Notes (Addendum)
 Toc discharge meds delivered from [facility] outpatient pharmacy by this RN

## 2023-12-07 NOTE — TOC Transition Note (Signed)
 Transition of Care [facility]) - Discharge Note   Patient Details  Name: Paul Huber MRN: [id number] Date of Birth: [birth year]  [facility] CM/SW Contact:  Diona Browner, LCSW Phone Number: 12/07/2023, 11:11 AM   Clinical Narrative:    Pt to d/c home with wife and son. Plan for therapy is [facility]P. Pt has RW, no additional DME needed. No TOC needs.   Final next level of care: Home/Self Care Barriers to Discharge: No Barriers Identified   Patient Goals and CMS Choice Patient states their goals for this hospitalization and ongoing recovery are:: return home   Choice offered to / list presented to : NA [facility] ownership interest in [facility].provided to::  (NA)    Discharge Placement                       Discharge Plan and Services Additional resources added to the After Visit Summary for                  DME Arranged: N/A DME Agency: NA       HH Arranged: NA HH Agency: NA        Social Drivers of Health (SDOH) Interventions SDOH Screenings   Food Insecurity: Patient Declined (12/06/2023)  Housing: Patient Declined (12/06/2023)  Transportation Needs: Patient Declined (12/06/2023)  Utilities: Patient Declined (12/06/2023)  Financial Resource Strain: Low Risk  (10/16/2023)   Received from [facility]  Physical Activity: Inactive (10/16/2023)   Received from [facility]  Social Connections: Patient Declined (12/06/2023)  Recent Concern: Social Connections - Moderately Isolated (10/16/2023)   Received from [facility]  Stress: No Stress Concern Present (10/16/2023)   Received from [facility]  Tobacco Use: Medium Risk (12/06/2023)  Health Literacy: Low Risk  (10/16/2023)   Received from [facility]     Readmission Risk Interventions     No data to display

## 2023-12-07 NOTE — Progress Notes (Signed)
 Physical Therapy Treatment Patient Details Name: Paul Huber MRN: [id number] DOB: 1935-04-25 Today's Date: 12/07/2023   History of Present Illness Pt is an 89 year old male s/p Left THA on 12/06/23.  PMHx: arthritis, dysrhythmia, nonischemic cardiomyopathy, PVC    PT Comments  Pt ambulated in hallway and performed LE exercises.  No family present this morning so will return this afternoon to ambulate again and practice safe stair technique in preparation for return home upon d/c.     If plan is discharge home, recommend the following: A little help with walking and/or transfers;A little help with bathing/dressing/bathroom;Assistance with cooking/housework;Help with stairs or ramp for entrance   Can travel by private vehicle        Equipment Recommendations  None recommended by PT    Recommendations for Other Services       Precautions / Restrictions Precautions Precautions: Fall Restrictions Weight Bearing Restrictions Per Provider Order: No Other Position/Activity Restrictions: WBAT     Mobility  Bed Mobility Overal bed mobility: Needs Assistance Bed Mobility: Supine to Sit     Supine to sit: Used rails     General bed mobility comments: cues for technique and self assist of L LE    Transfers Overall transfer level: Needs assistance Equipment used: Rolling walker (2 wheels) Transfers: Sit to/from Stand Sit to Stand: Contact guard assist           General transfer comment: verbal cues for UE and LE positioning for pain control    Ambulation/Gait Ambulation/Gait assistance: Contact guard assist Gait Distance (Feet): 100 Feet Assistive device: Rolling walker (2 wheels) Gait Pattern/deviations: Step-to pattern, Decreased stance time - left, Antalgic       General Gait Details: verbal cues for sequence, step length, RW positioning   Stairs             Wheelchair Mobility     Tilt Bed    Modified Rankin (Stroke Patients Only)        Balance                                            Communication Communication Factors Affecting Communication: Hearing impaired  Cognition Arousal: Alert Behavior During Therapy: WFL for tasks assessed/performed   PT - Cognitive impairments: No apparent impairments                         Following commands: Intact      Cueing    Exercises Total Joint Exercises Ankle Circles/Pumps: AROM, Both, 10 reps Quad Sets: AROM, Both, 10 reps Short Arc Quad: AROM, Left, 10 reps Heel Slides: AAROM, Left, 10 reps Hip ABduction/ADduction: AROM, Left, Standing, 10 reps Long Arc Quad: AROM, Left, Seated, 10 reps Knee Flexion: 10 reps, AROM, Standing, Left Marching in Standing: AROM, Left, Standing, 10 reps Standing Hip Extension: AROM, Left, Standing, 10 reps    General Comments        Pertinent Vitals/Pain Pain Assessment Pain Assessment: 0-10 Pain Score: 5  Pain Location: left hip Pain Descriptors / Indicators: Aching, Sore Pain Intervention(s): Repositioned, Monitored during session    Home Living                          Prior Function            PT Goals (current  goals can now be found in the care plan section) Progress towards PT goals: Progressing toward goals    Frequency    7X/week      PT Plan      Co-evaluation              AM-PAC PT "6 Clicks" Mobility   Outcome Measure  Help needed turning from your back to your side while in a flat bed without using bedrails?: A Little Help needed moving from lying on your back to sitting on the side of a flat bed without using bedrails?: A Little Help needed moving to and from a bed to a chair (including a wheelchair)?: A Little Help needed standing up from a chair using your arms (e.g., wheelchair or bedside chair)?: A Little Help needed to walk in hospital room?: A Little Help needed climbing 3-5 steps with a railing? : A Lot 6 Click Score: 17    End of Session  Equipment Utilized During Treatment: Gait belt Activity Tolerance: Patient tolerated treatment well Patient left: in chair;with call bell/phone within reach;with chair alarm set;with family/visitor present   PT Visit Diagnosis: Difficulty in walking, not elsewhere classified (R26.2)     Time: 1029-1050 PT Time Calculation (min) (ACUTE ONLY): 21 min  Charges:    $Therapeutic Exercise: 8-22 mins PT General Charges $$ ACUTE PT VISIT: 1 Visit          [name], DPT Physical Therapist Acute Rehabilitation Services Office: [phone]    [name] 12/07/2023, 12:19 PM

## 2023-12-07 NOTE — Plan of Care (Signed)
 Problem: Education: Goal: Knowledge of General Education information will improve Description: Including pain rating scale, medication(s)/side effects and non-pharmacologic comfort measures 12/07/2023 0035 by Floyce Stakes, RN Outcome: Adequate for Discharge 12/06/2023 2324 by Floyce Stakes, RN Outcome: Progressing   Problem: Health Behavior/Discharge Planning: Goal: Ability to manage health-related needs will improve 12/07/2023 0035 by Floyce Stakes, RN Outcome: Adequate for Discharge 12/06/2023 2324 by Floyce Stakes, RN Outcome: Progressing   Problem: Clinical Measurements: Goal: Ability to maintain clinical measurements within normal limits will improve 12/07/2023 0035 by Floyce Stakes, RN Outcome: Progressing 12/06/2023 2324 by Floyce Stakes, RN Outcome: Progressing Goal: Will remain free from infection 12/07/2023 0035 by Floyce Stakes, RN Outcome: Progressing 12/06/2023 2324 by Floyce Stakes, RN Outcome: Progressing Goal: Diagnostic test results will improve 12/07/2023 0035 by Floyce Stakes, RN Outcome: Progressing 12/06/2023 2324 by Floyce Stakes, RN Outcome: Progressing Goal: Respiratory complications will improve 12/07/2023 0035 by Floyce Stakes, RN Outcome: Adequate for Discharge 12/06/2023 2324 by Floyce Stakes, RN Outcome: Progressing Goal: Cardiovascular complication will be avoided 12/07/2023 0035 by Floyce Stakes, RN Outcome: Progressing 12/06/2023 2324 by Floyce Stakes, RN Outcome: Progressing   Problem: Activity: Goal: Risk for activity intolerance will decrease 12/07/2023 0035 by Floyce Stakes, RN Outcome: Progressing 12/06/2023 2324 by Floyce Stakes, RN Outcome: Adequate for Discharge   Problem: Nutrition: Goal: Adequate nutrition will be maintained Outcome: Completed/Met   Problem: Coping: Goal: Level of anxiety will decrease 12/07/2023 0035 by Floyce Stakes, RN Outcome: Progressing 12/06/2023 2324 by Floyce Stakes, RN Outcome: Progressing   Problem: Elimination: Goal: Will not experience complications related to bowel motility 12/07/2023 0035 by Floyce Stakes, RN Outcome: Progressing 12/06/2023 2324 by Floyce Stakes, RN Outcome: Progressing Goal: Will not experience complications related to urinary retention 12/07/2023 0035 by Floyce Stakes, RN Outcome: Progressing 12/06/2023 2324 by Floyce Stakes, RN Outcome: Progressing   Problem: Pain Managment: Goal: General experience of comfort will improve and/or be controlled 12/07/2023 0035 by Floyce Stakes, RN Outcome: Adequate for Discharge 12/06/2023 2324 by Floyce Stakes, RN Outcome: Progressing   Problem: Safety: Goal: Ability to remain free from injury will improve 12/07/2023 0035 by Floyce Stakes, RN Outcome: Progressing 12/06/2023 2324 by Floyce Stakes, RN Outcome: Progressing   Problem: Skin Integrity: Goal: Risk for impaired skin integrity will decrease 12/07/2023 0035 by Floyce Stakes, RN Outcome: Progressing 12/06/2023 2324 by Floyce Stakes, RN Outcome: Progressing   Problem: Education: Goal: Knowledge of the prescribed therapeutic regimen will improve 12/07/2023 0035 by Floyce Stakes, RN Outcome: Progressing 12/06/2023 2324 by Floyce Stakes, RN Outcome: Progressing Goal: Understanding of discharge needs will improve 12/07/2023 0035 by Floyce Stakes, RN Outcome: Progressing 12/06/2023 2324 by Floyce Stakes, RN Outcome: Progressing Goal: Individualized Educational Video(s) Outcome: Completed/Met   Problem: Activity: Goal: Ability to avoid complications of mobility impairment will improve 12/07/2023 0035 by Floyce Stakes, RN Outcome: Progressing 12/06/2023 2324 by Floyce Stakes, RN Outcome: Progressing Goal: Ability to tolerate increased activity will improve 12/07/2023 0035  by Floyce Stakes, RN Outcome: Progressing 12/06/2023 2324 by Floyce Stakes, RN Outcome: Adequate for Discharge   Problem: Clinical Measurements: Goal: Postoperative complications will be avoided or minimized 12/07/2023 0035 by Floyce Stakes, RN Outcome: Progressing 12/06/2023 2324 by Floyce Stakes, RN Outcome: Progressing   Problem: Pain Management: Goal: Pain level will decrease with appropriate interventions  12/07/2023 0035 by Floyce Stakes, RN Outcome: Adequate for Discharge 12/06/2023 2324 by Floyce Stakes, RN Outcome: Progressing   Problem: Skin Integrity: Goal: Will show signs of wound healing 12/07/2023 0035 by Floyce Stakes, RN Outcome: Progressing 12/06/2023 2324 by Floyce Stakes, RN Outcome: Progressing   Problem: Education: Goal: Knowledge of the prescribed therapeutic regimen will improve 12/07/2023 0035 by Floyce Stakes, RN Outcome: Progressing 12/06/2023 2324 by Floyce Stakes, RN Outcome: Progressing Goal: Understanding of discharge needs will improve 12/07/2023 0035 by Floyce Stakes, RN Outcome: Progressing 12/06/2023 2324 by Floyce Stakes, RN Outcome: Progressing Goal: Individualized Educational Video(s) Outcome: Completed/Met

## 2023-12-07 NOTE — Plan of Care (Signed)
 Patient discharged via private vehicle with wife and son, following successful void. (Foley was left in place during two PT sessions per patient request.) Verified that medications delivered to room by [facility] outpatient pharmacy. AVS and discharge instruction provided and patient verbalizes understanding. Haydee Salter, RN 12/07/23 4:26 PM

## 2023-12-07 NOTE — Progress Notes (Signed)
 Physical Therapy Treatment Patient Details Name: Paul Huber MRN: [id number] DOB: 06-Jun-1935 Today's Date: 12/07/2023   History of Present Illness Pt is an 89 year old male s/p Left THA on 12/06/23.  PMHx: arthritis, dysrhythmia, nonischemic cardiomyopathy, PVC    PT Comments  Pt ambulated in hallway and practiced safe stair technique with family.  Provided education on safety, stairs, and use of gait belt for family.  Pt with poor balance prior to this surgery and recommended pt maintain hands on RW and use for stability while recovering as well as having family hold gait belt initially.  Pt and family provided with HEP and stair handouts. Pt feels ready for d/c home today.  Pt and family questions all answered within scope of practice.     If plan is discharge home, recommend the following: A little help with walking and/or transfers;A little help with bathing/dressing/bathroom;Assistance with cooking/housework;Help with stairs or ramp for entrance   Can travel by private vehicle        Equipment Recommendations  None recommended by PT    Recommendations for Other Services       Precautions / Restrictions Precautions Precautions: Fall Restrictions Weight Bearing Restrictions Per Provider Order: No Other Position/Activity Restrictions: WBAT     Mobility  Bed Mobility Overal bed mobility: Needs Assistance Bed Mobility: Supine to Sit     Supine to sit: Used rails     General bed mobility comments: pt in recliner    Transfers Overall transfer level: Needs assistance Equipment used: Rolling walker (2 wheels) Transfers: Sit to/from Stand Sit to Stand: Contact guard assist           General transfer comment: verbal cues for UE and LE positioning for pain control    Ambulation/Gait Ambulation/Gait assistance: Contact guard assist Gait Distance (Feet): 140 Feet Assistive device: Rolling walker (2 wheels) Gait Pattern/deviations: Step-to pattern, Decreased stance  time - left, Antalgic, Step-through pattern       General Gait Details: verbal cues for sequence, step length, RW positioning   Stairs Stairs: Yes Stairs assistance: Contact guard assist Stair Management: Forwards, One rail Left, With cane Number of Stairs: 2 General stair comments: verbal cues for safety, sequence, positioning; pt performed twiced (once with family holding gait belt); family educated on safely assisting pt at home (gait belt, positioning, weight shifting, etc) since pt would like to take a flight into his main level (also has only a couple steps but has a 100 ft incline towards those steps apparently); handout also provided   Wheelchair Mobility     Tilt Bed    Modified Rankin (Stroke Patients Only)       Balance                                            Communication Communication Factors Affecting Communication: Hearing impaired  Cognition Arousal: Alert Behavior During Therapy: WFL for tasks assessed/performed   PT - Cognitive impairments: No apparent impairments                         Following commands: Intact      Cueing    Exercises     General Comments        Pertinent Vitals/Pain Pain Assessment Pain Assessment: 0-10 Pain Score: 4  Pain Location: left hip Pain Descriptors / Indicators: Aching, Sore  Pain Intervention(s): Monitored during session, Repositioned    Home Living                          Prior Function            PT Goals (current goals can now be found in the care plan section) Progress towards PT goals: Progressing toward goals    Frequency    7X/week      PT Plan      Co-evaluation              AM-PAC PT "6 Clicks" Mobility   Outcome Measure  Help needed turning from your back to your side while in a flat bed without using bedrails?: A Little Help needed moving from lying on your back to sitting on the side of a flat bed without using bedrails?: A  Little Help needed moving to and from a bed to a chair (including a wheelchair)?: A Little Help needed standing up from a chair using your arms (e.g., wheelchair or bedside chair)?: A Little Help needed to walk in hospital room?: A Little Help needed climbing 3-5 steps with a railing? : A Little 6 Click Score: 18    End of Session Equipment Utilized During Treatment: Gait belt Activity Tolerance: Patient tolerated treatment well Patient left: in chair;with call bell/phone within reach;with family/visitor present;with chair alarm set Nurse Communication: Mobility status PT Visit Diagnosis: Difficulty in walking, not elsewhere classified (R26.2)     Time: 4098-1191 PT Time Calculation (min) (ACUTE ONLY): 22 min  Charges:    $Gait Training: 8-22 mins  PT General Charges $$ ACUTE PT VISIT: 1 Visit           Paul Huber, DPT Physical Therapist Acute Rehabilitation Services Office: [phone]    [name] 12/07/2023, 3:18 PM

## 2023-12-07 NOTE — Progress Notes (Signed)
    Subjective:  Patient reports pain as mild.  Denies N/V/CP/SOB. No c/o.  Objective:   VITALS:   Vitals:   12/06/23 2222 12/07/23 0134 12/07/23 0509 12/07/23 1322  BP: 135/70 127/63 121/65 (!) 143/63  Pulse: 74 78 74   Resp: 19 17 17 15   Temp: 97.6 F (36.4 C) 98 F (36.7 C) 97.7 F (36.5 C) 98.2 F (36.8 C)  TempSrc: Oral Oral Oral   SpO2: 92% 92% 91% 95%  Weight:      Height:        NAD ABD soft Intact pulses distally Dorsiflexion/Plantar flexion intact Incision: dressing C/D/I Compartment soft   Lab Results  Component Value Date   WBC 11.1 (H) 12/07/2023   HGB 9.0 (L) 12/07/2023   HCT 27.6 (L) 12/07/2023   MCV 100.4 (H) 12/07/2023   PLT 130 (L) 12/07/2023   BMET    Component Value Date/Time   NA 139 12/07/2023 0326   NA 146 (H) 03/22/2023 1337   K 3.8 12/07/2023 0326   CL 107 12/07/2023 0326   CO2 22 12/07/2023 0326   GLUCOSE 142 (H) 12/07/2023 0326   BUN 19 12/07/2023 0326   BUN 20 03/22/2023 1337   CREATININE 1.00 12/07/2023 0326   CALCIUM 7.8 (L) 12/07/2023 0326   EGFR 67 03/22/2023 1337   GFRNONAA >60 12/07/2023 0326     Assessment/Plan: 1 Day Post-Op   Principal Problem:   Osteoarthritis of left hip   WBAT with walker DVT ppx: Aspirin, SCDs, TEDS PO pain control PT/OT Dispo: D/C home after clears PT   Jonette Pesa 12/07/2023, 2:17 PM   Samson Frederic, MD [phone] [facility] is now [facility]  [place] [address], [address], [place], [place] [address] Phone: [phone] [email or web address] Facebook  [employer]

## 2023-12-13 ENCOUNTER — Other Ambulatory Visit: Payer: Self-pay | Admitting: Cardiovascular Disease

## 2024-02-27 ENCOUNTER — Other Ambulatory Visit: Payer: Self-pay | Admitting: Cardiovascular Disease

## 2024-03-31 ENCOUNTER — Other Ambulatory Visit: Payer: Self-pay | Admitting: Cardiovascular Disease

## 2024-04-16 ENCOUNTER — Other Ambulatory Visit: Payer: Self-pay | Admitting: Cardiovascular Disease

## 2024-05-12 ENCOUNTER — Other Ambulatory Visit: Payer: Self-pay | Admitting: *Deleted

## 2024-05-12 MED ORDER — DILTIAZEM HCL ER BEADS 120 MG PO CP24: 120.0000 mg | ORAL_CAPSULE | Freq: Every day | ORAL | 0 refills | Status: DC

## 2024-07-04 ENCOUNTER — Encounter: Payer: Self-pay | Admitting: Cardiovascular Disease

## 2024-07-04 ENCOUNTER — Ambulatory Visit: Attending: Cardiovascular Disease | Admitting: Cardiovascular Disease

## 2024-07-04 VITALS — BP 126/60 | HR 73 | Ht 71.0 in | Wt 224.4 lb

## 2024-07-04 DIAGNOSIS — E782 Mixed hyperlipidemia: Secondary | ICD-10-CM | POA: Diagnosis not present

## 2024-07-04 DIAGNOSIS — I428 Other cardiomyopathies: Secondary | ICD-10-CM

## 2024-07-04 DIAGNOSIS — I447 Left bundle-branch block, unspecified: Secondary | ICD-10-CM

## 2024-07-04 DIAGNOSIS — I493 Ventricular premature depolarization: Secondary | ICD-10-CM | POA: Diagnosis not present

## 2024-07-04 NOTE — Progress Notes (Signed)
 Cardiology Office Note    Date:  07/04/2024   ID:  Paul Huber, DOB 12/16/1934, MRN [id number]  PCP:  Rosan Mix, MD  Cardiologist:   Jerel Balding, MD   Chief Complaint  Patient presents with   Irregular Heart Beat    History of Present Illness:  Paul Huber is a 89 y.o. male with a history of nonischemic cardiomyopathy and symptomatic PVCs presenting for follow-up.   He has not had any cardiovascular complaints, but he remains rather unsteady on his feet and has had a couple of serious falls over the summer.  Both were related to problems with balance and discomfort in his right knee.  He had knee replacement surgery in March 2025.  Neither one of the falls was associated with any dizziness or loss of consciousness.  Unfortunately he has gained back some weight and he is now in the mildly obese range again with a BMI of about 31.  He has not had any problems with chest pain or shortness of breath and has not had palpitations in a very long time.  He has not had syncope.  He is not troubled by lower extremity edema, orthopnea or PND.  His ECG today shows that he has now developed a full left bundle branch block with a QRS duration of 142 ms (in the past had incomplete left bundle branch block with QRS duration of 116 ms for at least 10 years).  His last echocardiogram was in 2021.  His most recent echocardiogram from December 2021 showed normal left ventricular systolic function (3D calculated EF 61%) and no other significant structural abnormalities).  In 2019 he had a normal treadmill stress test (but did have poor exercise tolerance at just over 4 minutes)  Previously had LVEF 40-45% by echo in 2003.  He had normal coronary arteries by angiography in 2003.  He had a normal nuclear stress test in 2008.  Diltiazem  controls palpitations well (previous monitoring showed PACs and PVCs).  On a couple of occasions he has presented to the office with asymptomatic accelerated  junctional rhythm (negative inferior lead P waves with a short PR interval).  Today he is in normal sinus rhythm.    Past Medical History:  Diagnosis Date   Accelerated junctional rhythm    Arthritis    Cancer (HCC)    Dyslipidemia    Dysrhythmia    Hypothyroidism    NICM (nonischemic cardiomyopathy) (HCC)    PVC (premature ventricular contraction) 12/07/2010   2D ECHO- EF >55%    Past Surgical History:  Procedure Laterality Date   CARDIAC CATHETERIZATION Left 07/25/2002   No significant CAD   TOTAL HIP ARTHROPLASTY Left 12/06/2023   Procedure: ARTHROPLASTY, HIP, TOTAL, ANTERIOR APPROACH;  Surgeon: Fidel Rogue, MD;  Location: [facility];  Service: Orthopedics;  Laterality: Left;    Current Medications: Outpatient Medications Prior to Visit  Medication Sig Dispense Refill   atorvastatin  (LIPITOR) 20 MG tablet TAKE ONE TABLET BY MOUTH EVERY DAY 54 tablet 0   diltiazem  (TIAZAC ) 120 MG 24 hr capsule Take 1 capsule (120 mg total) by mouth daily. 60 capsule 0   levothyroxine  (SYNTHROID , LEVOTHROID) 137 MCG tablet Take 1 tablet by mouth daily.     sertraline  (ZOLOFT ) 100 MG tablet Take 100 mg by mouth daily.  11   celecoxib  (CELEBREX ) 200 MG capsule Take 200 mg by mouth daily.     HYDROcodone -acetaminophen  (NORCO/VICODIN) 5-325 MG tablet Take 1 tablet by mouth every 4 (four) hours  as needed for moderate pain (pain score 4-6). 30 tablet 0   ondansetron  (ZOFRAN ) 4 MG tablet Take 1 tablet (4 mg total) by mouth every 8 (eight) hours as needed for nausea or vomiting. 20 tablet 0   polyethylene glycol powder (MIRALAX ) 17 GM/SCOOP powder Take 17 grams dissolve in liquid and take by mouth daily as needed for moderate constipation or severe constipation. 238 g 0   No facility-administered medications prior to visit.     Allergies:   Patient has no known allergies.   Family History:  The patient's family history includes Cancer in his maternal grandmother; Dementia in his mother; Heart attack  in his maternal grandfather; Heart disease in his brother; Heart disease (age of onset: 53) in his father; Heart failure in his paternal grandfather.   ROS:   Please see the history of present illness.    ROS All other systems are reviewed and are negative   PHYSICAL EXAM:   VS:  BP 126/60 (BP Location: Left Arm)   Pulse 73   Ht 5' 11 (1.803 m)   SpO2 97%   BMI 30.82 kg/m      General: Alert, oriented x3, no distress, obese Head: no evidence of trauma, PERRL, EOMI, no exophtalmos or lid lag, no myxedema, no xanthelasma; normal ears, nose and oropharynx Neck: normal jugular venous pulsations and no hepatojugular reflux; brisk carotid pulses without delay and no carotid bruits Chest: clear to auscultation, no signs of consolidation by percussion or palpation, normal fremitus, symmetrical and full respiratory excursions Cardiovascular: normal position and quality of the apical impulse, regular rhythm, normal first and paradoxically split second heart sounds, no murmurs, rubs or gallops Abdomen: no tenderness or distention, no masses by palpation, no abnormal pulsatility or arterial bruits, normal bowel sounds, no hepatosplenomegaly Extremities: no clubbing, cyanosis or edema; 2+ radial, ulnar and brachial pulses bilaterally; 2+ right femoral, posterior tibial and dorsalis pedis pulses; 2+ left femoral, posterior tibial and dorsalis pedis pulses; no subclavian or femoral bruits Neurological: grossly nonfocal Psych: Normal mood and affect  Wt Readings from Last 3 Encounters:  12/06/23 221 lb (100.2 kg)  11/30/23 221 lb (100.2 kg)  03/16/23 221 lb 3.2 oz (100.3 kg)      Studies/Labs Reviewed:   ECHO 09/02/2020:   1. Left ventricular ejection fraction, by estimation, is 60 to 65%. Left  ventricular ejection fraction by 3D volume is 61 %. The left ventricle has  normal function. The left ventricle has no regional wall motion  abnormalities. Left ventricular diastolic   parameters  are consistent with Grade I diastolic dysfunction (impaired  relaxation).   2. Right ventricular systolic function is normal. The right ventricular  size is normal. There is normal pulmonary artery systolic pressure. The  estimated right ventricular systolic pressure is 23.4 mmHg.   3. The mitral valve is grossly normal. Trivial mitral valve  regurgitation. No evidence of mitral stenosis.   4. The aortic valve is tricuspid. Aortic valve regurgitation is not  visualized. No aortic stenosis is present.   5. The inferior vena cava is normal in size with greater than 50%  respiratory variability, suggesting right atrial pressure of 3 mmHg.   EKG:    EKG Interpretation Date/Time:  Friday July 04 2024 15:56:17 EDT Ventricular Rate:  73 PR Interval:  96 QRS Duration:  142 QT Interval:  440 QTC Calculation: 484 R Axis:   -42  Text Interpretation: Sinus rhythm with short PR Left axis deviation Left bundle branch block  Compared to the previous tracing, left bundle branch block has replaced incomplete left bundle branch block Confirmed by Margues Filippini ([id number]) on 07/04/2024 5:08:07 PM         ASSESSMENT:    1. Nonischemic cardiomyopathy (HCC)   2. LBBB (left bundle branch block)   3. Mixed hyperlipidemia   4. PVC's (premature ventricular contractions)      PLAN:  In order of problems listed above:   History of cardiomyopathy: It is possible that he had transient myocarditis or PVC cardiomyopathy in 2003.  Left ventricular systolic function is now completely normal.   Has progressed to complete LBBB so I think that we should recheck an echocardiogram to make sure there is been no change in left ventricular systolic function.   LBBB: He does not have any symptoms to suggest high-grade AV block at this time.  Discussed the possibility that he may eventually need pacemaker therapy if there is worsening conduction abnormality Hyperlipidemia: Has not had a lipid profile since June 2024  when his LDL cholesterol was reasonably good at 71. Accelerated junctional rhythm: Asymptomatic events were detected in the past.  PACs/PVCs: Currently asymptomatic, very well controlled on the current low-dose of diltiazem .  I do not think the diltiazem  can be incriminated in worsening of his intraventricular conduction abnormalities.    Medication Adjustments/Labs and Tests Ordered: Current medicines are reviewed at length with the patient today.  Concerns regarding medicines are outlined above.  Medication changes, Labs and Tests ordered today are listed in the Patient Instructions below. Patient Instructions  Medication Instructions:  No changes *If you need a refill on your cardiac medications before your next appointment, please call your pharmacy*  Lab Work: Lipid panel If you have labs (blood work) drawn today and your tests are completely normal, you will receive your results only by: MyChart Message (if you have MyChart) OR A paper copy in the mail If you have any lab test that is abnormal or we need to change your treatment, we will call you to review the results.  Testing/Procedures: Your physician has requested that you have an echocardiogram. Echocardiography is a painless test that uses sound waves to create images of your heart. It provides your doctor with information about the size and shape of your heart and how well your heart's chambers and valves are working. This procedure takes approximately one hour. There are no restrictions for this procedure. Please do NOT wear cologne, perfume, aftershave, or lotions (deodorant is allowed). Please arrive 15 minutes prior to your appointment time.  Please note: We ask at that you not bring children with you during ultrasound (echo/ vascular) testing. Due to room size and safety concerns, children are not allowed in the ultrasound rooms during exams. Our front office staff cannot provide observation of children in our lobby area  while testing is being conducted. An adult accompanying a patient to their appointment will only be allowed in the ultrasound room at the discretion of the ultrasound technician under special circumstances. We apologize for any inconvenience.   Follow-Up: At [facility], you and your health needs are our priority.  As part of our continuing mission to provide you with exceptional heart care, our providers are all part of one team.  This team includes your primary Cardiologist (physician) and Advanced Practice Providers or APPs (Physician Assistants and Nurse Practitioners) who all work together to provide you with the care you need, when you need it.  Your next appointment:   1  year(s)  Provider:   Jerel Balding, MD    We recommend signing up for the patient portal called MyChart.  Sign up information is provided on this After Visit Summary.  MyChart is used to connect with patients for Virtual Visits (Telemedicine).  Patients are able to view lab/test results, encounter notes, upcoming appointments, etc.  Non-urgent messages can be sent to your provider as well.   To learn more about what you can do with MyChart, go to [email or web address].      Signed, Jerel Balding, MD  07/04/2024 5:29 PM    [facility] [address] [place], [place], [place]  [address] Phone: [phone]; Fax: [phone]

## 2024-07-04 NOTE — Patient Instructions (Signed)
 Medication Instructions:  No changes *If you need a refill on your cardiac medications before your next appointment, please call your pharmacy*  Lab Work: Lipid panel If you have labs (blood work) drawn today and your tests are completely normal, you will receive your results only by: MyChart Message (if you have MyChart) OR A paper copy in the mail If you have any lab test that is abnormal or we need to change your treatment, we will call you to review the results.  Testing/Procedures: Your physician has requested that you have an echocardiogram. Echocardiography is a painless test that uses sound waves to create images of your heart. It provides your doctor with information about the size and shape of your heart and how well your heart's chambers and valves are working. This procedure takes approximately one hour. There are no restrictions for this procedure. Please do NOT wear cologne, perfume, aftershave, or lotions (deodorant is allowed). Please arrive 15 minutes prior to your appointment time.  Please note: We ask at that you not bring children with you during ultrasound (echo/ vascular) testing. Due to room size and safety concerns, children are not allowed in the ultrasound rooms during exams. Our front office staff cannot provide observation of children in our lobby area while testing is being conducted. An adult accompanying a patient to their appointment will only be allowed in the ultrasound room at the discretion of the ultrasound technician under special circumstances. We apologize for any inconvenience.   Follow-Up: At [facility], you and your health needs are our priority.  As part of our continuing mission to provide you with exceptional heart care, our providers are all part of one team.  This team includes your primary Cardiologist (physician) and Advanced Practice Providers or APPs (Physician Assistants and Nurse Practitioners) who all work together to provide you  with the care you need, when you need it.  Your next appointment:   1 year(s)  Provider:   Jerel Balding, MD    We recommend signing up for the patient portal called MyChart.  Sign up information is provided on this After Visit Summary.  MyChart is used to connect with patients for Virtual Visits (Telemedicine).  Patients are able to view lab/test results, encounter notes, upcoming appointments, etc.  Non-urgent messages can be sent to your provider as well.   To learn more about what you can do with MyChart, go to [email or web address].

## 2024-07-10 ENCOUNTER — Other Ambulatory Visit: Payer: Self-pay | Admitting: Cardiovascular Disease

## 2024-08-14 ENCOUNTER — Ambulatory Visit (HOSPITAL_COMMUNITY)
Admission: RE | Admit: 2024-08-14 | Discharge: 2024-08-14 | Disposition: A | Discharge status: Home / Self Care | Source: Ambulatory Visit | Attending: Cardiology | Admitting: Cardiology

## 2024-08-14 DIAGNOSIS — I428 Other cardiomyopathies: Secondary | ICD-10-CM

## 2024-08-14 LAB — ECHOCARDIOGRAM COMPLETE
Area-P 1/2: 3.36 cm2
MV M vel: 5.39 m/s
MV Peak grad: 116.2 mmHg
S' Lateral: 2 cm

## 2024-08-15 ENCOUNTER — Ambulatory Visit: Payer: Self-pay | Admitting: Cardiovascular Disease
